# Patient Record
Sex: Female | Born: 1980
Health system: Southern US, Community
[De-identification: ages and names within clinical notes are randomized; demographics above are authoritative.]

## PROBLEM LIST (undated history)

## (undated) DIAGNOSIS — E039 Hypothyroidism, unspecified: Secondary | ICD-10-CM

## (undated) DIAGNOSIS — O24419 Gestational diabetes mellitus in pregnancy, unspecified control: Secondary | ICD-10-CM

## (undated) HISTORY — PX: BREAST SURGERY: SHX581

## (undated) HISTORY — PX: BREAST REDUCTION SURGERY: SHX8

---

## 2005-03-30 DIAGNOSIS — O24419 Gestational diabetes mellitus in pregnancy, unspecified control: Secondary | ICD-10-CM

## 2005-03-30 HISTORY — DX: Gestational diabetes mellitus in pregnancy, unspecified control: O24.419

## 2006-12-09 ENCOUNTER — Ambulatory Visit: Payer: Self-pay | Admitting: Otolaryngology

## 2007-03-31 HISTORY — PX: REDUCTION MAMMAPLASTY: SUR839

## 2008-09-03 ENCOUNTER — Ambulatory Visit: Payer: Self-pay

## 2010-01-17 ENCOUNTER — Ambulatory Visit: Payer: Self-pay | Admitting: Family Medicine

## 2011-10-24 ENCOUNTER — Emergency Department: Payer: Self-pay | Admitting: Emergency Medicine

## 2012-02-13 ENCOUNTER — Emergency Department: Payer: Self-pay | Admitting: Emergency Medicine

## 2012-02-13 LAB — CBC
HCT: 41.5 % (ref 35.0–47.0)
HGB: 13.7 g/dL (ref 12.0–16.0)
MCH: 28.9 pg (ref 26.0–34.0)
MCHC: 33 g/dL (ref 32.0–36.0)
MCV: 88 fL (ref 80–100)
RBC: 4.75 10*6/uL (ref 3.80–5.20)

## 2012-02-13 LAB — BASIC METABOLIC PANEL
BUN: 9 mg/dL (ref 7–18)
Calcium, Total: 8.6 mg/dL (ref 8.5–10.1)
Co2: 22 mmol/L (ref 21–32)
Creatinine: 0.78 mg/dL (ref 0.60–1.30)
EGFR (African American): >60
EGFR (Non-African Amer.): >60
Glucose: 128 mg/dL — ABNORMAL HIGH (ref 65–99)
Osmolality: 278 (ref 275–301)
Potassium: 3.9 mmol/L (ref 3.5–5.1)
Sodium: 139 mmol/L (ref 136–145)

## 2012-02-13 LAB — TSH: Thyroid Stimulating Horm: 1.57 u[IU]/mL

## 2014-02-01 LAB — OB RESULTS CONSOLE RPR: RPR: NONREACTIVE

## 2014-02-01 LAB — OB RESULTS CONSOLE TSH: TSH: 5.74

## 2014-02-01 LAB — OB RESULTS CONSOLE VARICELLA ZOSTER ANTIBODY, IGG: Varicella: IMMUNE

## 2014-02-01 LAB — OB RESULTS CONSOLE HGB/HCT, BLOOD
HCT: 36 %
Hemoglobin: 11.8 g/dL

## 2014-02-01 LAB — OB RESULTS CONSOLE PLATELET COUNT: Platelets: 336 10*3/uL

## 2014-02-01 LAB — OB RESULTS CONSOLE RUBELLA ANTIBODY, IGM: Rubella: IMMUNE

## 2014-02-01 LAB — OB RESULTS CONSOLE HIV ANTIBODY (ROUTINE TESTING): HIV: NONREACTIVE

## 2014-02-01 LAB — OB RESULTS CONSOLE GC/CHLAMYDIA
Chlamydia: NEGATIVE
GC PROBE AMP, GENITAL: NEGATIVE

## 2014-02-01 LAB — OB RESULTS CONSOLE ABO/RH: RH TYPE: POSITIVE

## 2014-02-01 LAB — OB RESULTS CONSOLE HEPATITIS B SURFACE ANTIGEN: Hepatitis B Surface Ag: NEGATIVE

## 2014-03-30 NOTE — L&D Delivery Note (Signed)
Delivery Note At 9:43 PM a viable female was delivered via VBAC, Spontaneous (Presentation: Left Occiput Anterior). Infant's name: Will  APGAR: 9, 9 weight 5 lb 6.8 oz (2460 g).   Placenta status: Intact, Spontaneous.  Cord: 3 vessels with the following complications: Short cord. Anesthesia: None  Episiotomy: None Lacerations: 1st degree;Labial Suture Repair: 2.0 vicryl under local anesthesia  Est. Blood Loss (mL): 275 Mom to postpartum.  Baby to Couplet care / Skin to Skin.   Un-medicated VBAC of a live viable female infant in LOA position. After delivery, the infant was placed on the maternal abdomen where a short cord was noted. The cord was clamped and cut by the fob after pulsation ceased. Apgars were 9/9. The placenta was then delivered spontaneously via schultz mechanism rapidly after the delivery of the infant and was intact with a 3-vessel cord. Pitocin was then started and the uterus was firm with fundal massage and had minimal bleeding. EBL was ~275. The perineum was inspected and was found to have a small first degree and left labial tear. It was repaired under local anesthesia with a 2-0 vicryl and was  Hemostatic after repair. Mother and baby are stable and bonding well.   Jannet MantisSubudhi,  Kalinda Romaniello 08/29/2014, 10:25 PM

## 2014-08-14 LAB — OB RESULTS CONSOLE GBS: GBS: NEGATIVE

## 2014-08-29 ENCOUNTER — Inpatient Hospital Stay
Admission: EM | Admit: 2014-08-29 | Discharge: 2014-08-31 | DRG: 775 | Disposition: A | Discharge status: Home / Self Care | Payer: 59 | Attending: Obstetrics and Gynecology | Admitting: Obstetrics and Gynecology

## 2014-08-29 ENCOUNTER — Encounter: Payer: Self-pay | Admitting: *Deleted

## 2014-08-29 DIAGNOSIS — Z79899 Other long term (current) drug therapy: Secondary | ICD-10-CM

## 2014-08-29 DIAGNOSIS — O3421 Maternal care for scar from previous cesarean delivery: Secondary | ICD-10-CM | POA: Diagnosis present

## 2014-08-29 DIAGNOSIS — O09523 Supervision of elderly multigravida, third trimester: Secondary | ICD-10-CM

## 2014-08-29 DIAGNOSIS — O99284 Endocrine, nutritional and metabolic diseases complicating childbirth: Secondary | ICD-10-CM | POA: Diagnosis present

## 2014-08-29 DIAGNOSIS — D62 Acute posthemorrhagic anemia: Secondary | ICD-10-CM | POA: Diagnosis present

## 2014-08-29 DIAGNOSIS — Z8632 Personal history of gestational diabetes: Secondary | ICD-10-CM

## 2014-08-29 DIAGNOSIS — O9081 Anemia of the puerperium: Secondary | ICD-10-CM | POA: Diagnosis not present

## 2014-08-29 DIAGNOSIS — Z3A38 38 weeks gestation of pregnancy: Secondary | ICD-10-CM | POA: Diagnosis present

## 2014-08-29 DIAGNOSIS — O09213 Supervision of pregnancy with history of pre-term labor, third trimester: Secondary | ICD-10-CM

## 2014-08-29 DIAGNOSIS — O34219 Maternal care for unspecified type scar from previous cesarean delivery: Secondary | ICD-10-CM | POA: Diagnosis present

## 2014-08-29 DIAGNOSIS — Z98891 History of uterine scar from previous surgery: Secondary | ICD-10-CM

## 2014-08-29 DIAGNOSIS — O99283 Endocrine, nutritional and metabolic diseases complicating pregnancy, third trimester: Secondary | ICD-10-CM | POA: Diagnosis present

## 2014-08-29 HISTORY — DX: Hypothyroidism, unspecified: E03.9

## 2014-08-29 HISTORY — DX: Gestational diabetes mellitus in pregnancy, unspecified control: O24.419

## 2014-08-29 LAB — CBC
HCT: 40.9 % (ref 35.0–47.0)
Hemoglobin: 13.6 g/dL (ref 12.0–16.0)
MCH: 29 pg (ref 26.0–34.0)
MCHC: 33.3 g/dL (ref 32.0–36.0)
MCV: 87.1 fL (ref 80.0–100.0)
Platelets: 230 10*3/uL (ref 150–440)
RBC: 4.69 MIL/uL (ref 3.80–5.20)
RDW: 13.9 % (ref 11.5–14.5)
WBC: 12.3 10*3/uL — AB (ref 3.6–11.0)

## 2014-08-29 LAB — COMPREHENSIVE METABOLIC PANEL
ALT: 19 U/L (ref 14–54)
ANION GAP: 8 (ref 5–15)
AST: 38 U/L (ref 15–41)
Albumin: 2.9 g/dL — ABNORMAL LOW (ref 3.5–5.0)
Alkaline Phosphatase: 172 U/L — ABNORMAL HIGH (ref 38–126)
BUN: 9 mg/dL (ref 6–20)
CHLORIDE: 108 mmol/L (ref 101–111)
CO2: 20 mmol/L — ABNORMAL LOW (ref 22–32)
Calcium: 8.5 mg/dL — ABNORMAL LOW (ref 8.9–10.3)
Creatinine, Ser: 0.59 mg/dL (ref 0.44–1.00)
GFR calc non Af Amer: >60 mL/min (ref >60)
Glucose, Bld: 64 mg/dL — ABNORMAL LOW (ref 65–99)
Potassium: 4.8 mmol/L (ref 3.5–5.1)
Sodium: 136 mmol/L (ref 135–145)
TOTAL PROTEIN: 7.3 g/dL (ref 6.5–8.1)
Total Bilirubin: 0.9 mg/dL (ref 0.3–1.2)

## 2014-08-29 LAB — TYPE AND SCREEN
ABO/RH(D): O POS
Antibody Screen: NEGATIVE

## 2014-08-29 MED ORDER — ACETAMINOPHEN 325 MG PO TABS: 650.0000 mg | ORAL_TABLET | ORAL | Status: DC | PRN

## 2014-08-29 MED ORDER — OXYTOCIN BOLUS FROM INFUSION
500.0000 mL | INTRAVENOUS | Status: DC
  Administered 2014-08-29: 500 mL via INTRAVENOUS

## 2014-08-29 MED ORDER — LEVOTHYROXINE SODIUM 75 MCG PO TABS
75.0000 ug | ORAL_TABLET | Freq: Every day | ORAL | Status: DC
  Filled 2014-08-29 (×2): qty 1

## 2014-08-29 MED ORDER — LACTATED RINGERS IV SOLN
INTRAVENOUS | Status: DC
  Administered 2014-08-29: 19:00:00 via INTRAVENOUS

## 2014-08-29 MED ORDER — BUTORPHANOL TARTRATE 1 MG/ML IJ SOLN
1.0000 mg | INTRAMUSCULAR | Status: DC | PRN
  Administered 2014-08-29: 2 mg via INTRAVENOUS

## 2014-08-29 MED ORDER — LACTATED RINGERS IV SOLN: 500.0000 mL | INTRAVENOUS | Status: DC | PRN

## 2014-08-29 MED ORDER — LIDOCAINE HCL (PF) 1 % IJ SOLN
INTRAMUSCULAR | Status: AC
Start: 2014-08-29 — End: 2014-08-29
  Administered 2014-08-29: 30 mL
  Filled 2014-08-29: qty 30

## 2014-08-29 MED ORDER — OXYTOCIN 40 UNITS IN LACTATED RINGERS INFUSION - SIMPLE MED: 62.5000 mL/h | INTRAVENOUS | Status: DC

## 2014-08-29 MED ORDER — BUTORPHANOL TARTRATE 1 MG/ML IJ SOLN
INTRAMUSCULAR | Status: AC
  Administered 2014-08-29: 2 mg via INTRAVENOUS
  Filled 2014-08-29: qty 2

## 2014-08-29 MED ORDER — ONDANSETRON HCL 4 MG/2ML IJ SOLN: 4.0000 mg | Freq: Four times a day (QID) | INTRAMUSCULAR | Status: DC | PRN

## 2014-08-29 MED ORDER — OXYTOCIN 40 UNITS IN LACTATED RINGERS INFUSION - SIMPLE MED
INTRAVENOUS | Status: AC
  Filled 2014-08-29: qty 1000

## 2014-08-29 NOTE — H&P (Signed)
Obstetric History and Physical  Kirke CorinRebecca R Hegel is a 34 y.o. 272 [id number] with IUP at 6368w3d presenting for contractions that started earlier today. Patient states she has been having  regular contractions, none vaginal bleeding, intact membranes, with active fetal movement.    Prenatal Course Source of Care: [facility]  with onset of care at 8 weeks Pregnancy complications or risks:  Pt is AMA, has a history of GDM with G2. She also has a history of a c/s for FITL with G1 with a successful VBAC in 2007 (was of a PTB  at 35 weeks). She has been on Makena this pregnancy. She is currently on synthroid for hypothyroidism Patient Active Problem List   Diagnosis Date Noted  . Labor and delivery, indication for care 08/29/2014  . History of cesarean section 08/29/2014  . History of successful vaginal birth after cesarean, currently pregnant 08/29/2014   She plans to breastfeed   Prenatal labs and studies: ABO, Rh: O/Positive/-- (11/05 0000) Antibody:   Rubella: Immune (11/05 0000) Varicella: immune RPR: Nonreactive (11/05 0000)  HBsAg: Negative (11/05 0000)  HIV: Non-reactive (11/05 0000)  WUX:LKGMWNUUGBS:Negative (05/17 0000) 1 hr Glucola  86 Genetic screening NIPT normal XY Anatomy US normal Tdap: given 07/06/14 Flu: NA   Past Medical History  Diagnosis Date  . Hypothyroidism   . Gestational diabetes 2007    Past Surgical History  Procedure Laterality Date  . Cesarean section    . Breast reduction surgery Bilateral   . Breast surgery    . Reduction mammaplasty      OB History  Gravida Para Term Preterm AB SAB TAB Ectopic Multiple Living  3 2 1 1      2     # Outcome Date GA Lbr Len/2nd Weight Sex Delivery Anes PTL Lv  3 Current           2 Preterm 10/16/05 10109w3d   M VBAC EPI Y Y  1 Term 09/26/03 9077w0d   M CS-LTranv Spinal N Y     Complications: Fetal Intolerance      History   Social History  . Marital Status: Single    Spouse Name: N/A  . Number of Children: N/A  . Years of  Education: N/A   Social History Main Topics  . Smoking status: Never Smoker   . Smokeless tobacco: Not on file  . Alcohol Use: No  . Drug Use: No  . Sexual Activity: Yes    Birth Control/ Protection: None   Other Topics Concern  . None   Social History Narrative  . None    Family History  Problem Relation Age of Onset  . Diabetes Mother   . Hypertension Maternal Grandfather     Prescriptions prior to admission  Medication Sig Dispense Refill Last Dose  . levothyroxine (SYNTHROID, LEVOTHROID) 75 MCG tablet Take 75 mcg by mouth daily before breakfast.   08/29/2014 at Unknown time  . Prenatal Vit-Fe Fumarate-FA (MULTIVITAMIN-PRENATAL) 27-0.8 MG TABS tablet Take 1 tablet by mouth daily at 12 noon.   08/29/2014 at Unknown time    No Known Allergies  Review of Systems: Negative except for what is mentioned in HPI.  Physical Exam: LMP 12/03/2013  BP 141/90 mmHg  Pulse 81  Temp(Src) 97.1 F (36.2 C) (Oral)  Resp 22  Ht 5\' 4"  (1.626 m)  Wt 170 lb (77.111 kg)  BMI 29.17 kg/m2  LMP 12/03/2013  GENERAL: Well-developed, well-nourished female in no acute distress.  LUNGS: Clear to auscultation bilaterally.  HEART: Regular rate and rhythm. ABDOMEN: Soft, nontender, nondistended, gravid. EXTREMITIES: Nontender, no edema, 2+ distal pulses.  Dilation: 6 Effacement (%): 80 Station: -1 Presentation: Vertex Exam by:: Toni Amend, CNM   Presentation: cephalic FHT:  Baseline rate 135-145 bpm   Variability moderate  Accelerations present   Decelerations none Contractions: Every 2 mins  Pertinent Labs/Studies:   No results found for this or any previous visit (from the past 24 hour(s)).  Labs pending currently   Assessment : RUTHELL FEIGENBAUM is a 34 y.o. [id number] at [redacted]w[redacted]d being admitted for labor.  Plan: Labor: Expectant management.   FWB: Reassuring fetal heart tracing.   Pt plans for no analgesia, but is open to IV medication or epidural-will give on request.  GBS  negative Delivery plan: Hopeful for vaginal delivery   Jannet Mantis, [facility]  Reviewed patient history and added recent vital signs into this note.  Noted to be mildly elevated.  Will continue to monitor.  Expect vaginal delivery. Was called to hospital per [facility] VBAC protocol. Will be in-house until delivery.

## 2014-08-29 NOTE — Discharge Summary (Signed)
Obstetrical Discharge Summary  Date of Admission: 08/29/2014 Date of Discharge: 08/31/2014 Primary OB: [facility]   Pt with a pregnancy complicated by AMA, hypothyroidism, history of cesarean section with a successful VBAC, preterm birth in 2007- [place] this [place].   Gestational Age at Delivery: 3151w3d  Antepartum complications: none Date of Delivery: 08/31/2014 Delivered By: Debbie Rivera. Debbie Rivera, CNM  Delivery Type: vaginal birth after cesarean (VBAC) Intrapartum complications/course: None Anesthesia: none Placenta: spontaneous Laceration: 1st degree and labial Episiotomy: none Baby: Liveborn female, Apgars 9/9, weight 5 lb. 6.8 oz  2460 g.   Post partum course: Since the delivery, patient has tolerate activity, diet, and daily functions without difficulty or complication.  Min lochia.  No breast concerns at this time.  No signs of depression currently.  Disposition: discharged home  Rh Immune globulin given: not applicable Rubella vaccine given: not applicable Tdap vaccine given in AP or PP setting: yes Flu vaccine given in AP or PP setting: not applicable Contraception: plans Mirena   Prenatal Labs: O POS//Rubella Immune//RPR negative//HIV negative/HepB Surface Ag negative//pap no abnormalities //plans to breastfeed//female  Plan:  Debbie Rivera was discharged to home in good condition. [facility]  Plan IUD then No future appointments.  Discharge Medications:   Medication List    TAKE these medications        ibuprofen 600 MG tablet  Commonly known as:  ADVIL,MOTRIN  Take 1 tablet (600 mg total) by mouth every 6 (six) hours as needed for moderate pain.     levothyroxine 75 MCG tablet  Commonly known as:  SYNTHROID, LEVOTHROID  Take 75 mcg by mouth daily before breakfast.     multivitamin-prenatal 27-0.8 MG Tabs tablet  Take 1 tablet by mouth daily at 12 noon.

## 2014-08-30 LAB — CBC
HEMATOCRIT: 37.2 % (ref 35.0–47.0)
Hemoglobin: 12.3 g/dL (ref 12.0–16.0)
MCH: 28.9 pg (ref 26.0–34.0)
MCHC: 33.1 g/dL (ref 32.0–36.0)
MCV: 87.3 fL (ref 80.0–100.0)
Platelets: 206 10*3/uL (ref 150–440)
RBC: 4.26 MIL/uL (ref 3.80–5.20)
RDW: 13.7 % (ref 11.5–14.5)
WBC: 20.1 10*3/uL — ABNORMAL HIGH (ref 3.6–11.0)

## 2014-08-30 LAB — PROTEIN / CREATININE RATIO, URINE: Creatinine, Urine: 53 mg/dL

## 2014-08-30 LAB — ABO/RH: ABO/RH(D): O POS

## 2014-08-30 MED ORDER — OXYCODONE-ACETAMINOPHEN 5-325 MG PO TABS: 2.0000 | ORAL_TABLET | ORAL | Status: DC | PRN

## 2014-08-30 MED ORDER — IBUPROFEN 600 MG PO TABS
600.0000 mg | ORAL_TABLET | Freq: Four times a day (QID) | ORAL | Status: DC
  Administered 2014-08-30: 600 mg via ORAL
  Filled 2014-08-30: qty 1

## 2014-08-30 MED ORDER — OXYTOCIN 40 UNITS IN LACTATED RINGERS INFUSION - SIMPLE MED: 62.5000 mL/h | Freq: Once | INTRAVENOUS | Status: AC

## 2014-08-30 MED ORDER — PRENATAL MULTIVITAMIN CH
1.0000 | ORAL_TABLET | Freq: Every day | ORAL | Status: DC
Start: 2014-08-30 — End: 2014-08-31
  Administered 2014-08-30: 1 via ORAL
  Filled 2014-08-30: qty 1

## 2014-08-30 MED ORDER — IBUPROFEN 600 MG PO TABS
600.0000 mg | ORAL_TABLET | Freq: Four times a day (QID) | ORAL | Status: DC
  Administered 2014-08-30 – 2014-08-31 (×3): 600 mg via ORAL
  Filled 2014-08-30 (×3): qty 1

## 2014-08-30 MED ORDER — SIMETHICONE 80 MG PO CHEW: 80.0000 mg | CHEWABLE_TABLET | ORAL | Status: DC | PRN

## 2014-08-30 MED ORDER — ACETAMINOPHEN 325 MG PO TABS: 650.0000 mg | ORAL_TABLET | ORAL | Status: DC | PRN

## 2014-08-30 MED ORDER — OXYCODONE-ACETAMINOPHEN 5-325 MG PO TABS: 1.0000 | ORAL_TABLET | ORAL | Status: DC | PRN

## 2014-08-30 MED ORDER — DIPHENHYDRAMINE HCL 25 MG PO CAPS: 25.0000 mg | ORAL_CAPSULE | Freq: Four times a day (QID) | ORAL | Status: DC | PRN

## 2014-08-30 MED ORDER — LANOLIN HYDROUS EX OINT: TOPICAL_OINTMENT | CUTANEOUS | Status: DC | PRN

## 2014-08-30 MED ORDER — METHYLERGONOVINE MALEATE 0.2 MG PO TABS: 0.2000 mg | ORAL_TABLET | ORAL | Status: DC | PRN

## 2014-08-30 MED ORDER — FERROUS SULFATE 325 (65 FE) MG PO TABS
325.0000 mg | ORAL_TABLET | Freq: Two times a day (BID) | ORAL | Status: DC
  Administered 2014-08-30 – 2014-08-31 (×2): 325 mg via ORAL
  Filled 2014-08-30 (×2): qty 1

## 2014-08-30 MED ORDER — LEVOTHYROXINE SODIUM 75 MCG PO TABS
75.0000 ug | ORAL_TABLET | Freq: Every day | ORAL | Status: DC
  Administered 2014-08-30 – 2014-08-31 (×2): 75 ug via ORAL
  Filled 2014-08-30 (×3): qty 1

## 2014-08-30 MED ORDER — SENNOSIDES-DOCUSATE SODIUM 8.6-50 MG PO TABS
2.0000 | ORAL_TABLET | ORAL | Status: DC
  Administered 2014-08-30: 2 via ORAL
  Filled 2014-08-30: qty 2

## 2014-08-30 MED ORDER — MAGNESIUM HYDROXIDE 400 MG/5ML PO SUSP: 30.0000 mL | ORAL | Status: DC | PRN

## 2014-08-30 MED ORDER — BENZOCAINE-MENTHOL 20-0.5 % EX AERO: 1.0000 "application " | INHALATION_SPRAY | CUTANEOUS | Status: DC | PRN

## 2014-08-30 MED ORDER — ZOLPIDEM TARTRATE 5 MG PO TABS: 5.0000 mg | ORAL_TABLET | Freq: Every evening | ORAL | Status: DC | PRN

## 2014-08-30 MED ORDER — ONDANSETRON HCL 4 MG PO TABS: 4.0000 mg | ORAL_TABLET | ORAL | Status: DC | PRN

## 2014-08-30 MED ORDER — METHYLERGONOVINE MALEATE 0.2 MG/ML IJ SOLN: 0.2000 mg | INTRAMUSCULAR | Status: DC | PRN

## 2014-08-30 MED ORDER — ONDANSETRON HCL 4 MG/2ML IJ SOLN: 4.0000 mg | INTRAMUSCULAR | Status: DC | PRN

## 2014-08-30 MED ORDER — WITCH HAZEL-GLYCERIN EX PADS: 1.0000 "application " | MEDICATED_PAD | CUTANEOUS | Status: DC | PRN

## 2014-08-30 MED ORDER — DIBUCAINE 1 % RE OINT: 1.0000 "application " | TOPICAL_OINTMENT | RECTAL | Status: DC | PRN

## 2014-08-30 NOTE — Progress Notes (Signed)
Subjective:  Doing well, minimal lochia  Objective:   Blood pressure 106/68, pulse 83, temperature 98 F (36.7 C), temperature source Oral, resp. rate 18, height 5' 4" (1.626 m), weight 77.111 kg (170 lb), last menstrual period 12/03/2013, SpO2 98 %, unknown if currently breastfeeding.  General: NAD Pulmonary: no increased work of breathing Abdomen: non-distended, non-tender, fundus firm at level of umbilicus Extremities: no edema, no erythema, no tenderness  Results for orders placed or performed during the hospital encounter of 08/29/14 (from the past 72 hour(s))  Comprehensive metabolic panel     Status: Abnormal   Collection Time: 08/29/14  3:02 PM  Result Value Ref Range   Sodium 136 135 - 145 mmol/L   Potassium 4.8 3.5 - 5.1 mmol/L    Comment: HEMOLYSIS AT THIS LEVEL MAY AFFECT RESULT   Chloride 108 101 - 111 mmol/L   CO2 20 (L) 22 - 32 mmol/L   Glucose, Bld 64 (L) 65 - 99 mg/dL   BUN 9 6 - 20 mg/dL   Creatinine, Ser 0.59 0.44 - 1.00 mg/dL   Calcium 8.5 (L) 8.9 - 10.3 mg/dL   Total Protein 7.3 6.5 - 8.1 g/dL   Albumin 2.9 (L) 3.5 - 5.0 g/dL   AST 38 15 - 41 U/L    Comment: HEMOLYSIS AT THIS LEVEL MAY AFFECT RESULT   ALT 19 14 - 54 U/L    Comment: HEMOLYSIS AT THIS LEVEL MAY AFFECT RESULT   Alkaline Phosphatase 172 (H) 38 - 126 U/L   Total Bilirubin 0.9 0.3 - 1.2 mg/dL    Comment: HEMOLYSIS AT THIS LEVEL MAY AFFECT RESULT   GFR calc non Af Amer >60 >60 mL/min   GFR calc Af Amer >60 >60 mL/min    Comment: (NOTE) The eGFR has been calculated using the CKD EPI equation. This calculation has not been validated in all clinical situations. eGFR's persistently <60 mL/min signify possible Chronic Kidney Disease.    Anion gap 8 5 - 15  CBC     Status: Abnormal   Collection Time: 08/29/14  7:01 PM  Result Value Ref Range   WBC 12.3 (H) 3.6 - 11.0 K/uL   RBC 4.69 3.80 - 5.20 MIL/uL   Hemoglobin 13.6 12.0 - 16.0 g/dL   HCT 40.9 35.0 - 47.0 %   MCV 87.1 80.0 - 100.0 fL   MCH 29.0 26.0 - 34.0 pg   MCHC 33.3 32.0 - 36.0 g/dL   RDW 13.9 11.5 - 14.5 %   Platelets 230 150 - 440 K/uL  Type and screen     Status: None   Collection Time: 08/29/14  7:01 PM  Result Value Ref Range   ABO/RH(D) O POS    Antibody Screen NEG    Sample Expiration 09/01/2014   ABO/Rh     Status: None   Collection Time: 08/29/14  7:02 PM  Result Value Ref Range   ABO/RH(D) O POS   Protein / creatinine ratio, urine     Status: None   Collection Time: 08/29/14 11:18 PM  Result Value Ref Range   Creatinine, Urine 53 mg/dL   Total Protein, Urine <6 mg/dL    Comment: NO NORMAL RANGE ESTABLISHED FOR THIS TEST   Protein Creatinine Ratio        0.00 - 0.15 mg/mg[Cre]    Comment: RESULT BELOW REPORTABLE RANGE, UNABLE TO CALCULATE.   CBC     Status: Abnormal   Collection Time: 08/30/14  4:51 AM  Result Value Ref Range  WBC 20.1 (H) 3.6 - 11.0 K/uL   RBC 4.26 3.80 - 5.20 MIL/uL   Hemoglobin 12.3 12.0 - 16.0 g/dL   HCT 37.2 35.0 - 47.0 %   MCV 87.3 80.0 - 100.0 fL   MCH 28.9 26.0 - 34.0 pg   MCHC 33.1 32.0 - 36.0 g/dL   RDW 13.7 11.5 - 14.5 %   Platelets 206 150 - 440 K/uL    Assessment:   34 y.o. [id number] postpartum day # 1 TOLAC  Plan:    1) Acute blood loss anemia - hemodynamically stable and asymptomatic - po ferrous sulfate  2) Breast/Bottle/Contraception  3) Disposition PPD#2

## 2014-08-31 LAB — RPR: RPR: NONREACTIVE

## 2014-08-31 MED ORDER — IBUPROFEN 600 MG PO TABS: 600.0000 mg | ORAL_TABLET | Freq: Four times a day (QID) | ORAL | Status: DC | PRN

## 2014-08-31 NOTE — Progress Notes (Signed)
Subjective:  Doing well, minimal lochia, tol diet.  Ready for discharge.  Objective:   AF, VSS General: NAD Pulmonary: no increased work of breathing Abdomen: non-distended, non-tender, fundus firm at level of umbilicus Extremities: no edema, no erythema, no tenderness  Results for orders placed or performed during the hospital encounter of 08/29/14 (from the past 72 hour(s))  Comprehensive metabolic panel     Status: Abnormal   Collection Time: 08/29/14  3:02 PM  Result Value Ref Range   Sodium 136 135 - 145 mmol/L   Potassium 4.8 3.5 - 5.1 mmol/L    Comment: HEMOLYSIS AT THIS LEVEL MAY AFFECT RESULT   Chloride 108 101 - 111 mmol/L   CO2 20 (L) 22 - 32 mmol/L   Glucose, Bld 64 (L) 65 - 99 mg/dL   BUN 9 6 - 20 mg/dL   Creatinine, Ser 0.59 0.44 - 1.00 mg/dL   Calcium 8.5 (L) 8.9 - 10.3 mg/dL   Total Protein 7.3 6.5 - 8.1 g/dL   Albumin 2.9 (L) 3.5 - 5.0 g/dL   AST 38 15 - 41 U/L    Comment: HEMOLYSIS AT THIS LEVEL MAY AFFECT RESULT   ALT 19 14 - 54 U/L    Comment: HEMOLYSIS AT THIS LEVEL MAY AFFECT RESULT   Alkaline Phosphatase 172 (H) 38 - 126 U/L   Total Bilirubin 0.9 0.3 - 1.2 mg/dL    Comment: HEMOLYSIS AT THIS LEVEL MAY AFFECT RESULT   GFR calc non Af Amer >60 >60 mL/min   GFR calc Af Amer >60 >60 mL/min    Comment: (NOTE) The eGFR has been calculated using the CKD EPI equation. This calculation has not been validated in all clinical situations. eGFR's persistently <60 mL/min signify possible Chronic Kidney Disease.    Anion gap 8 5 - 15  CBC     Status: Abnormal   Collection Time: 08/29/14  7:01 PM  Result Value Ref Range   WBC 12.3 (H) 3.6 - 11.0 K/uL   RBC 4.69 3.80 - 5.20 MIL/uL   Hemoglobin 13.6 12.0 - 16.0 g/dL   HCT 40.9 35.0 - 47.0 %   MCV 87.1 80.0 - 100.0 fL   MCH 29.0 26.0 - 34.0 pg   MCHC 33.3 32.0 - 36.0 g/dL   RDW 13.9 11.5 - 14.5 %   Platelets 230 150 - 440 K/uL  Type and screen     Status: None   Collection Time: 08/29/14  7:01 PM  Result  Value Ref Range   ABO/RH(D) O POS    Antibody Screen NEG    Sample Expiration 09/01/2014   RPR     Status: None   Collection Time: 08/29/14  7:01 PM  Result Value Ref Range   RPR Ser Ql Non Reactive Non Reactive    Comment: (NOTE) Performed At: [facility] [address] [place], [place] [id number] Lindon Romp MD [id number]   ABO/Rh     Status: None   Collection Time: 08/29/14  7:02 PM  Result Value Ref Range   ABO/RH(D) O POS   Protein / creatinine ratio, urine     Status: None   Collection Time: 08/29/14 11:18 PM  Result Value Ref Range   Creatinine, Urine 53 mg/dL   Total Protein, Urine <6 mg/dL    Comment: NO NORMAL RANGE ESTABLISHED FOR THIS TEST   Protein Creatinine Ratio        0.00 - 0.15 mg/mg[Cre]    Comment: RESULT BELOW REPORTABLE RANGE, UNABLE TO  CALCULATE.   CBC     Status: Abnormal   Collection Time: 08/30/14  4:51 AM  Result Value Ref Range   WBC 20.1 (H) 3.6 - 11.0 K/uL   RBC 4.26 3.80 - 5.20 MIL/uL   Hemoglobin 12.3 12.0 - 16.0 g/dL   HCT 37.2 35.0 - 47.0 %   MCV 87.3 80.0 - 100.0 fL   MCH 28.9 26.0 - 34.0 pg   MCHC 33.1 32.0 - 36.0 g/dL   RDW 13.7 11.5 - 14.5 %   Platelets 206 150 - 440 K/uL    Assessment:   34 y.o. [id number] postpartum day # 2 VBAC  Plan:    1) Acute blood loss anemia - hemodynamically stable and asymptomatic - po ferrous sulfate  2) Breast  3) Disposition HOME  4) IUD pp

## 2014-08-31 NOTE — Progress Notes (Signed)
D/C order from MD.  Reviewed d/c instructions and prescriptions with patient and answered any questions.  Patient d/c home with infant via wheelchair by nursing/auxillary. 

## 2014-08-31 NOTE — Discharge Instructions (Signed)
Postpartum Care After Vaginal Delivery °After you deliver your newborn (postpartum period), the usual stay in the hospital is 24-72 hours. If there were problems with your labor or delivery, or if you have other medical problems, you might be in the hospital longer.  °While you are in the hospital, you will receive help and instructions on how to care for yourself and your newborn during the postpartum period.  °While you are in the hospital: °· Be sure to tell your nurses if you have pain or discomfort, as well as where you feel the pain and what makes the pain worse. °· If you had an incision made near your vagina (episiotomy) or if you had some tearing during delivery, the nurses may put ice packs on your episiotomy or tear. The ice packs may help to reduce the pain and swelling. °· If you are breastfeeding, you may feel uncomfortable contractions of your uterus for a couple of weeks. This is normal. The contractions help your uterus get back to normal size. °· It is normal to have some bleeding after delivery. °¨ For the first 1-3 days after delivery, the flow is red and the amount may be similar to a period. °¨ It is common for the flow to start and stop. °¨ In the first few days, you may pass some small clots. Let your nurses know if you begin to pass large clots or your flow increases. °¨ Do not  flush blood clots down the toilet before having the nurse look at them. °¨ During the next 3-10 days after delivery, your flow should become more watery and pink or brown-tinged in color. °¨ Ten to fourteen days after delivery, your flow should be a small amount of yellowish-white discharge. °¨ The amount of your flow will decrease over the first few weeks after delivery. Your flow may stop in 6-8 weeks. Most women have had their flow stop by 12 weeks after delivery. °· You should change your sanitary pads frequently. °· Wash your hands thoroughly with soap and water for at least 20 seconds after changing pads, using  the toilet, or before holding or feeding your newborn. °· You should feel like you need to empty your bladder within the first 6-8 hours after delivery. °· In case you become weak, lightheaded, or faint, call your nurse before you get out of bed for the first time and before you take a shower for the first time. °· Within the first few days after delivery, your breasts may begin to feel tender and full. This is called engorgement. Breast tenderness usually goes away within 48-72 hours after engorgement occurs. You may also notice milk leaking from your breasts. If you are not breastfeeding, do not stimulate your breasts. Breast stimulation can make your breasts produce more milk. °· Spending as much time as possible with your newborn is very important. During this time, you and your newborn can feel close and get to know each other. Having your newborn stay in your room (rooming in) will help to strengthen the bond with your newborn.  It will give you time to get to know your newborn and become comfortable caring for your newborn. °· Your hormones change after delivery. Sometimes the hormone changes can temporarily cause you to feel sad or tearful. These feelings should not last more than a few days. If these feelings last longer than that, you should talk to your caregiver. °· If desired, talk to your caregiver about methods of family planning or contraception. °·   Talk to your caregiver about immunizations. Your caregiver may want you to have the following immunizations before leaving the hospital: °¨ Tetanus, diphtheria, and pertussis (Tdap) or tetanus and diphtheria (Td) immunization. It is very important that you and your family (including grandparents) or others caring for your newborn are up-to-date with the Tdap or Td immunizations. The Tdap or Td immunization can help protect your newborn from getting ill. °¨ Rubella immunization. °¨ Varicella (chickenpox) immunization. °¨ Influenza immunization. You should  receive this annual immunization if you did not receive the immunization during your pregnancy. °Document Released: 01/11/2007 Document Revised: 12/09/2011 Document Reviewed: 11/11/2011 °ExitCare® Patient Information ©2015 [employer]. This information is not intended to replace advice given to you by your health care provider. Make sure you discuss any questions you have with your health care provider. ° °Please call your doctor or return to the ER if you experience any chest pains, shortness of breath, fever greater than 101, any heavy bleeding or large clots, and foul smelling vaginal discharge, any worsening abdominal pain & cramping that is not controlled by pain medication, or any signs of post partum depression.  No tampons, enemas, douches, or sexual intercourse for 6 weeks.  Also avoid tub baths, hot tubs, or swimming for 6 weeks. ° °

## 2015-02-28 LAB — HM PAP SMEAR: HM Pap smear: NORMAL

## 2015-04-19 DIAGNOSIS — Z6826 Body mass index (BMI) 26.0-26.9, adult: Secondary | ICD-10-CM | POA: Diagnosis not present

## 2015-04-19 DIAGNOSIS — E039 Hypothyroidism, unspecified: Secondary | ICD-10-CM | POA: Diagnosis not present

## 2015-04-19 DIAGNOSIS — R635 Abnormal weight gain: Secondary | ICD-10-CM | POA: Diagnosis not present

## 2015-04-19 DIAGNOSIS — Z01419 Encounter for gynecological examination (general) (routine) without abnormal findings: Secondary | ICD-10-CM | POA: Diagnosis not present

## 2015-05-22 ENCOUNTER — Encounter: Payer: 59 | Attending: Obstetrics & Gynecology | Admitting: Dietician

## 2015-05-22 VITALS — Ht 64.0 in | Wt 157.6 lb

## 2015-05-22 DIAGNOSIS — E039 Hypothyroidism, unspecified: Secondary | ICD-10-CM | POA: Insufficient documentation

## 2015-05-22 DIAGNOSIS — E663 Overweight: Secondary | ICD-10-CM | POA: Diagnosis not present

## 2015-05-22 NOTE — Progress Notes (Signed)
Medical Nutrition Therapy: Visit start time: 0900  end time: 1000  Assessment:  Diagnosis: overweight Past medical history: hypothyroidism Psychosocial issues/ stress concerns: none Preferred learning method:  . Hands-on  Current weight: 157.6lbs  Height: 5'4" Medications, supplements: Synthroid  Progress and evaluation: Reports having lost several pounds recently using calorie-counting app on phone.          Has been riunning prior to pregnancy, is gradually resuming.          Feels like her biggest problem is carbs, loves bread.          She followed a Diabetes meal plan after second pregnancy which worked well, and wants an updated plan to lose weight since she has had a third baby.   Physical activity: walking, jogging for 60 minutes 3 times a week. Plans to increase frequency.  Dietary Intake:  Usual eating pattern includes 3 meals and 2 snacks per day. Dining out frequency: 2 meals per week.  Breakfast: 11/2 pcs peanut butter toast.  Snack: not usually, occasional fiber bar or cheese stick Lunch: tuna salad 2/21 no bread, 5 crackers. Usually sandwich.  Snack: same as am Supper: roasted vegetables butternut squash, edamame, onions, mushrooms, 1/2 bratwurst, not much meat usually. Trying to avoid carbs. Snack: sometimes if extra calories 1/2 cup mint ice cream, occ 85% dark chocolate.  Beverages: mostly water.  Note: Patient states she does not like to eat much meat, could go vegetarian.   Nutrition Care Education: Topics covered: weight management Basic nutrition: basic food groups, appropriate nutrient balance    Weight control: 1300kcal meal plan for weight loss with balance of 40%CHO, 30%protein, 30% fat per patient preference.       Resources for food portions, nutrition information, minimal daily carb and protein needs  Nutritional Diagnosis:  [place]-3.3 Overweight/obesity As related to recent pregnancy, hypothyroidism.  As evidenced by patient report.  Intervention:  Instruction as noted above.   No follow-up scheduled, as patient is self-motivated and can follow meal plan on her own.    Patient is making healthy food choices generally, and is exercising regularly.     Education Materials given:  . Food lists/ Planning A Balanced Meal  Daily Meal Plan Guide (lilly) Learner/ who was taught:  . Patient    Level of understanding: Marland Kitchen Verbalizes/ demonstrates competency  Demonstrated degree of understanding via:   Teach back Learning barriers: . None   Willingness to learn/ readiness for change: . Eager, change in progress  Monitoring and Evaluation:  Dietary intake, exercise, and body weight      follow up: prn

## 2015-05-22 NOTE — Patient Instructions (Signed)
Follow 1300kcal meal plan

## 2015-06-27 DIAGNOSIS — H5213 Myopia, bilateral: Secondary | ICD-10-CM | POA: Diagnosis not present

## 2015-06-27 DIAGNOSIS — H52223 Regular astigmatism, bilateral: Secondary | ICD-10-CM | POA: Diagnosis not present

## 2016-06-24 ENCOUNTER — Encounter: Payer: Self-pay | Admitting: Obstetrics & Gynecology

## 2016-06-24 ENCOUNTER — Ambulatory Visit (INDEPENDENT_AMBULATORY_CARE_PROVIDER_SITE_OTHER): Payer: 59 | Admitting: Obstetrics & Gynecology

## 2016-06-24 VITALS — BP 138/88 | HR 99 | Ht 64.0 in | Wt 138.0 lb

## 2016-06-24 DIAGNOSIS — Z0000 Encounter for general adult medical examination without abnormal findings: Secondary | ICD-10-CM | POA: Diagnosis not present

## 2016-06-24 DIAGNOSIS — E039 Hypothyroidism, unspecified: Secondary | ICD-10-CM | POA: Diagnosis not present

## 2016-06-24 MED ORDER — LEVOTHYROXINE SODIUM 75 MCG PO TABS: 75.0000 ug | ORAL_TABLET | Freq: Every day | ORAL | 12 refills | Status: DC

## 2016-06-24 NOTE — Progress Notes (Signed)
HPI:      [name] is a 36 y.o. [phone] who LMP was No LMP recorded., she presents today for her annual examination. The patient has no complaints today. The patient is sexually active.   last pap: was normal  The patient has regular exercise: yes.    GYN History: Contraception: IUD  PMHx: She  has a past medical history of Gestational diabetes (2007) and Hypothyroidism. Also,  has a past surgical history that includes Cesarean section; Breast reduction surgery (Bilateral); Breast surgery; and Reduction mammaplasty., family history includes Diabetes in her mother; Hypertension in her maternal grandfather.,  reports that she has been smoking.  She has never used smokeless tobacco. She reports that she does not drink alcohol or use drugs.  She has a current medication list which includes the following prescription(s): levonorgestrel, ibuprofen, levothyroxine, and multivitamin-prenatal. Also, has No Known Allergies.  Review of Systems  Constitutional: Negative for chills, fever and malaise/fatigue.  HENT: Negative for congestion, sinus pain and sore throat.   Eyes: Negative for blurred vision and pain.  Respiratory: Negative for cough and wheezing.   Cardiovascular: Negative for chest pain and leg swelling.  Gastrointestinal: Negative for abdominal pain, constipation, diarrhea, heartburn, nausea and vomiting.  Genitourinary: Negative for dysuria, frequency, hematuria and urgency.  Musculoskeletal: Negative for back pain, joint pain, myalgias and neck pain.  Skin: Negative for itching and rash.  Neurological: Negative for dizziness, tremors and weakness.  Endo/Heme/Allergies: Does not bruise/bleed easily.  Psychiatric/Behavioral: Negative for depression. The patient is not nervous/anxious and does not have insomnia.     Objective: BP 138/88   Pulse 99   Ht 5\' 4"  (1.626 m)   Wt 138 lb (62.6 kg)   BMI 23.69 kg/m  Physical Exam  Constitutional: She is oriented to person,  place, and time. She appears well-developed and well-nourished. No distress.  Genitourinary: Rectum normal, vagina normal and uterus normal. Pelvic exam was performed with patient supine. There is no rash or lesion on the right labia. There is no rash or lesion on the left labia. Vagina exhibits no lesion. No bleeding in the vagina. Right adnexum does not display mass and does not display tenderness. Left adnexum does not display mass and does not display tenderness. Cervix does not exhibit motion tenderness, lesion, friability or polyp.   Uterus is mobile and midaxial. Uterus is not enlarged or exhibiting a mass.  Genitourinary Comments: IUD strings 2 cm  HENT:  Head: Normocephalic and atraumatic. Head is without laceration.  Right Ear: Hearing normal.  Left Ear: Hearing normal.  Nose: No epistaxis.  No foreign bodies.  Mouth/Throat: Uvula is midline, oropharynx is clear and moist and mucous membranes are normal.  Eyes: Pupils are equal, round, and reactive to light.  Neck: Normal range of motion. Neck supple. No thyromegaly present.  Cardiovascular: Normal rate and regular rhythm.  Exam reveals no gallop and no friction rub.   No murmur heard. Pulmonary/Chest: Effort normal and breath sounds normal. No respiratory distress. She has no wheezes. Right breast exhibits no mass, no skin change and no tenderness. Left breast exhibits no mass, no skin change and no tenderness.  Abdominal: Soft. Bowel sounds are normal. She exhibits no distension. There is no tenderness. There is no rebound.  Musculoskeletal: Normal range of motion.  Neurological: She is alert and oriented to person, place, and time. No cranial nerve deficit.  Skin: Skin is warm and dry.  Psychiatric: She has a normal mood and affect.  Judgment normal.  Vitals reviewed.   Assessment:  ANNUAL EXAM 1. Annual physical exam   2. Acquired hypothyroidism      Screening Plan:            1.  Cervical Screening-  Pap smear schedule  reviewed with patient  2. Breast screening- Exam annually and mammogram>40 planned   3. . Labs THYROID today here  4. Counseling for contraception: IUD  Other:  1. Annual physical exam  2. Acquired hypothyroidism - TSH - cont levothyroxine    F/U  Return in about 1 year (around 06/24/2017) for Annual.  Annamarie Major, MD, [facility], [facility] 06/24/2016  11:44 AM

## 2016-06-25 LAB — TSH: TSH: 1.74 u[IU]/mL (ref 0.450–4.500)

## 2016-06-25 NOTE — Progress Notes (Signed)
Tried to call patient w results, but voice mail had a man's voice and name (no mention of a Larita FifeLynn or Lurena JoinerRebecca), so wondering if there is another number.  If you find one plz fwd back to me.  If she calls, then let her know TSH is dead-center normal and no change in dosing is recommended unless she just wants to try lower dose and then recehck levels again.

## 2016-06-25 NOTE — Progress Notes (Signed)
Pt aware of results 

## 2017-06-24 ENCOUNTER — Telehealth: Payer: Self-pay | Admitting: Family Medicine

## 2017-06-24 NOTE — Telephone Encounter (Signed)
Patient has not been seen since 12/21/13- would need a new patient appointment since has not been seen. Welcome to book new patient appointment with me, but I don't know when I'm booking to.

## 2017-06-24 NOTE — Telephone Encounter (Signed)
Copied from CRM [phone]. Topic: Appointment Scheduling - Scheduling Inquiry for Clinic >> Jun 22, 2017 10:35 AM Clack, Princella PellegriniJessica D wrote: Reason for CRM: Pt PCP listed as Dossie Arbourrissman, I do not see where she has been seen in the office. Also pt would like to know [facility]  Please f/u with pt.

## 2017-07-15 ENCOUNTER — Encounter: Payer: Self-pay | Admitting: Family Medicine

## 2017-07-15 ENCOUNTER — Ambulatory Visit (INDEPENDENT_AMBULATORY_CARE_PROVIDER_SITE_OTHER): Payer: No Typology Code available for payment source | Admitting: Family Medicine

## 2017-07-15 VITALS — BP 123/86 | HR 82 | Ht 64.5 in | Wt 136.0 lb

## 2017-07-15 DIAGNOSIS — Z7689 Persons encountering health services in other specified circumstances: Secondary | ICD-10-CM

## 2017-07-15 DIAGNOSIS — E039 Hypothyroidism, unspecified: Secondary | ICD-10-CM

## 2017-07-15 NOTE — Progress Notes (Signed)
   BP 123/86   Pulse 82   Ht 5' 4.5" (1.638 m)   Wt 136 lb (61.7 kg)   SpO2 98%   BMI 22.98 kg/m    Subjective:    Patient ID: Debbie Rivera, female    DOB: May 17, 1980, 37 y.o.   MRN: [id number]  HPI: Debbie Rivera is a 37 y.o. female  Chief Complaint  Patient presents with  . Establish Care  . Thyroid Problem   Pt here today to establish care. Only known medical problem is hypothyroidism, which has been doing very well the past year on 75 mcg synthroid. No side effects, denies any sxs of poor control such as constipation, diarrhea, dry skin, fatigue, depression. Last TSH check was a little over a year ago which was WNL.   No other concerns, due for CPE in the summer/early fall.   Relevant past medical, surgical, family and social history reviewed and updated as indicated. Interim medical history since our last visit reviewed. Allergies and medications reviewed and updated.  Review of Systems  Per HPI unless specifically indicated above     Objective:    BP 123/86   Pulse 82   Ht 5' 4.5" (1.638 m)   Wt 136 lb (61.7 kg)   SpO2 98%   BMI 22.98 kg/m   Wt Readings from Last 3 Encounters:  07/15/17 136 lb (61.7 kg)  06/24/16 138 lb (62.6 kg)  05/22/15 157 lb 9.6 oz (71.5 kg)    Physical Exam  Constitutional: She is oriented to person, place, and time. She appears well-developed and well-nourished. No distress.  HENT:  Head: Atraumatic.  Eyes: Pupils are equal, round, and reactive to light. Conjunctivae are normal.  Neck: Normal range of motion. Neck supple. No thyromegaly present.  Cardiovascular: Normal rate, regular rhythm, normal heart sounds and intact distal pulses.  Pulmonary/Chest: Breath sounds normal.  Musculoskeletal: Normal range of motion.  Neurological: She is alert and oriented to person, place, and time.  Skin: Skin is warm and dry.  Psychiatric: She has a normal mood and affect. Her behavior is normal.  Nursing note and vitals  reviewed.   Results for orders placed or performed in visit on 07/15/17  TSH  Result Value Ref Range   TSH 2.960 0.450 - 4.500 uIU/mL      Assessment & Plan:   Problem List Items Addressed This Visit      Endocrine   Hypothyroidism - Primary    Will check TSH today and adjust dose if needed. Continue current regimen      Relevant Orders   TSH (Completed)    Other Visit Diagnoses    Encounter to establish care           Follow up plan: Return in about 6 months (around 01/14/2018) for CPE.

## 2017-07-16 ENCOUNTER — Telehealth: Payer: Self-pay | Admitting: Family Medicine

## 2017-07-16 DIAGNOSIS — E039 Hypothyroidism, unspecified: Secondary | ICD-10-CM

## 2017-07-16 LAB — TSH: TSH: 2.96 u[IU]/mL (ref 0.450–4.500)

## 2017-07-16 MED ORDER — LEVOTHYROXINE SODIUM 75 MCG PO TABS: 75.0000 ug | ORAL_TABLET | Freq: Every day | ORAL | 12 refills | Status: DC

## 2017-07-16 NOTE — Telephone Encounter (Signed)
Patient notified

## 2017-07-16 NOTE — Telephone Encounter (Signed)
Please let her know that her thyroid came back normal and I've sent a year supply to her pharmacy. THanks!

## 2017-07-18 NOTE — Patient Instructions (Signed)
Follow up for CPE 

## 2017-07-18 NOTE — Assessment & Plan Note (Signed)
Will check TSH today and adjust dose if needed. Continue current regimen

## 2018-01-18 ENCOUNTER — Encounter: Payer: No Typology Code available for payment source | Admitting: Family Medicine

## 2018-01-31 ENCOUNTER — Encounter: Payer: Self-pay | Admitting: Family Medicine

## 2018-01-31 ENCOUNTER — Ambulatory Visit (INDEPENDENT_AMBULATORY_CARE_PROVIDER_SITE_OTHER): Payer: No Typology Code available for payment source | Admitting: Family Medicine

## 2018-01-31 VITALS — BP 118/78 | HR 92 | Temp 98.6°F | Ht 64.5 in | Wt 133.2 lb

## 2018-01-31 DIAGNOSIS — Z0000 Encounter for general adult medical examination without abnormal findings: Secondary | ICD-10-CM | POA: Diagnosis not present

## 2018-01-31 DIAGNOSIS — Z8632 Personal history of gestational diabetes: Secondary | ICD-10-CM | POA: Diagnosis not present

## 2018-01-31 DIAGNOSIS — E039 Hypothyroidism, unspecified: Secondary | ICD-10-CM | POA: Diagnosis not present

## 2018-01-31 LAB — UA/M W/RFLX CULTURE, ROUTINE
BILIRUBIN UA: NEGATIVE
GLUCOSE, UA: NEGATIVE
Leukocytes, UA: NEGATIVE
Nitrite, UA: NEGATIVE
Protein, UA: NEGATIVE
RBC UA: NEGATIVE
SPEC GRAV UA: 1.025 (ref 1.005–1.030)
UUROB: 0.2 mg/dL (ref 0.2–1.0)
pH, UA: 5 (ref 5.0–7.5)

## 2018-01-31 NOTE — Progress Notes (Signed)
BP 118/78   Pulse 92   Temp 98.6 F (37 C)   Ht 5' 4.5" (1.638 m)   Wt 133 lb 3 oz (60.4 kg)   LMP 01/29/2018 (Exact Date)   SpO2 95%   BMI 22.51 kg/m    Subjective:    Patient ID: Debbie Rivera, female    DOB: June 17, 1980, 37 y.o.   MRN: [id number]  HPI: Debbie Rivera is a 37 y.o. female presenting on 01/31/2018 for comprehensive medical examination. Current medical complaints include:see below  Gestational diabetes with her second pregnancy. Strong fhx of diabetes. Wanting to be checked.   Taking 75 mcg synthroid for her hypothyroidism. Feeling well overall.   Sees GYN for well woman exams, plans to schedule for sometime in January.   She currently lives with: Menopausal Symptoms: no  Depression Screen done today and results listed below:  Depression screen [facility] 2/9 01/31/2018 07/15/2017 05/22/2015  Decreased Interest 0 0 0  Down, Depressed, Hopeless 0 0 0  PHQ - 2 Score 0 0 0    The patient does not have a history of falls. I did not complete a risk assessment for falls. A plan of care for falls was not documented.   Past Medical History:  Past Medical History:  Diagnosis Date  . Gestational diabetes 2007  . Hypothyroidism     Surgical History:  Past Surgical History:  Procedure Laterality Date  . BREAST REDUCTION SURGERY Bilateral   . BREAST SURGERY    . CESAREAN SECTION    . REDUCTION MAMMAPLASTY      Medications:  Current Outpatient Medications on File Prior to Visit  Medication Sig  . ibuprofen (ADVIL,MOTRIN) 600 MG tablet Take 1 tablet (600 mg total) by mouth every 6 (six) hours as needed for moderate pain. (Patient not taking: Reported on 05/22/2015)  . levonorgestrel (MIRENA) 20 MCG/24HR IUD 1 each by Intrauterine route once.   No current facility-administered medications on file prior to visit.     Allergies:  No Known Allergies  Social History:  Social History   Socioeconomic History  . Marital status: Married    Spouse name: Not on file    . Number of children: Not on file  . Years of education: Not on file  . Highest education level: Not on file  Occupational History  . Not on file  Social Needs  . Financial resource strain: Not on file  . Food insecurity:    Worry: Not on file    Inability: Not on file  . Transportation needs:    Medical: Not on file    Non-medical: Not on file  Tobacco Use  . Smoking status: Light Tobacco Smoker  . Smokeless tobacco: Never Used  Substance and Sexual Activity  . Alcohol use: No  . Drug use: No  . Sexual activity: Yes    Birth control/protection: IUD  Lifestyle  . Physical activity:    Days per week: Not on file    Minutes per session: Not on file  . Stress: Not on file  Relationships  . Social connections:    Talks on phone: Not on file    Gets together: Not on file    Attends religious service: Not on file    Active member of club or organization: Not on file    Attends meetings of clubs or organizations: Not on file    Relationship status: Not on file  . Intimate partner violence:    Fear of current or  ex partner: Not on file    Emotionally abused: Not on file    Physically abused: Not on file    Forced sexual activity: Not on file  Other Topics Concern  . Not on file  Social History Narrative  . Not on file   Social History   Tobacco Use  Smoking Status Light Tobacco Smoker  Smokeless Tobacco Never Used   Social History   Substance and Sexual Activity  Alcohol Use No    Family History:  Family History  Problem Relation Age of Onset  . Diabetes Mother   . Hypertension Maternal Grandfather     Past medical history, surgical history, medications, allergies, family history and social history reviewed with patient today and changes made to appropriate areas of the chart.   Review of Systems - General ROS: negative Psychological ROS: negative Ophthalmic ROS: negative ENT ROS: negative Allergy and Immunology ROS: negative Hematological and Lymphatic  ROS: negative Endocrine ROS: negative Breast ROS: negative for breast lumps Respiratory ROS: no cough, shortness of breath, or wheezing Cardiovascular ROS: no chest pain or dyspnea on exertion Gastrointestinal ROS: no abdominal pain, change in bowel habits, or black or bloody stools Genito-Urinary ROS: no dysuria, trouble voiding, or hematuria Musculoskeletal ROS: negative Neurological ROS: no TIA or stroke symptoms Dermatological ROS: negative All other ROS negative except what is listed above and in the HPI.      Objective:    BP 118/78   Pulse 92   Temp 98.6 F (37 C)   Ht 5' 4.5" (1.638 m)   Wt 133 lb 3 oz (60.4 kg)   LMP 01/29/2018 (Exact Date)   SpO2 95%   BMI 22.51 kg/m   Wt Readings from Last 3 Encounters:  01/31/18 133 lb 3 oz (60.4 kg)  07/15/17 136 lb (61.7 kg)  06/24/16 138 lb (62.6 kg)    Physical Exam  Constitutional: She is oriented to person, place, and time. She appears well-developed and well-nourished. No distress.  HENT:  Head: Atraumatic.  Right Ear: External ear normal.  Left Ear: External ear normal.  Nose: Nose normal.  Mouth/Throat: Oropharynx is clear and moist. No oropharyngeal exudate.  Eyes: Pupils are equal, round, and reactive to light. Conjunctivae are normal. No scleral icterus.  Neck: Normal range of motion. Neck supple. No thyromegaly present.  Cardiovascular: Normal rate, regular rhythm, normal heart sounds and intact distal pulses.  Pulmonary/Chest: Effort normal and breath sounds normal. No respiratory distress.  Breast exam done through GYN  Abdominal: Soft. Bowel sounds are normal. She exhibits no mass. There is no tenderness.  Genitourinary:  Genitourinary Comments: GU exam done through GYN  Musculoskeletal: Normal range of motion. She exhibits no edema or tenderness.  Lymphadenopathy:    She has no cervical adenopathy.  Neurological: She is alert and oriented to person, place, and time. No cranial nerve deficit.  Skin: Skin  is warm and dry. No rash noted.  Psychiatric: She has a normal mood and affect. Her behavior is normal.  Nursing note and vitals reviewed.   Results for orders placed or performed in visit on 01/31/18  HM PAP SMEAR  Result Value Ref Range   HM Pap smear Normal   HgB A1c  Result Value Ref Range   Hgb A1c MFr Bld 5.5 4.8 - 5.6 %   Est. average glucose Bld gHb Est-mCnc 111 mg/dL  CBC with Differential/Platelet  Result Value Ref Range   WBC 5.2 3.4 - 10.8 x10E3/uL   RBC 4.91  3.77 - 5.28 x10E6/uL   Hemoglobin 14.0 11.1 - 15.9 g/dL   Hematocrit 40.9 81.1 - 46.6 %   MCV 85 79 - 97 fL   MCH 28.5 26.6 - 33.0 pg   MCHC 33.6 31.5 - 35.7 g/dL   RDW 91.4 78.2 - 95.6 %   Platelets 302 150 - 450 x10E3/uL   Neutrophils 42 Not Estab. %   Lymphs 46 Not Estab. %   Monocytes 8 Not Estab. %   Eos 3 Not Estab. %   Basos 1 Not Estab. %   Neutrophils Absolute 2.2 1.4 - 7.0 x10E3/uL   Lymphocytes Absolute 2.4 0.7 - 3.1 x10E3/uL   Monocytes Absolute 0.4 0.1 - 0.9 x10E3/uL   EOS (ABSOLUTE) 0.1 0.0 - 0.4 x10E3/uL   Basophils Absolute 0.1 0.0 - 0.2 x10E3/uL   Immature Granulocytes 0 Not Estab. %   Immature Grans (Abs) 0.0 0.0 - 0.1 x10E3/uL  Comprehensive metabolic panel  Result Value Ref Range   Glucose 84 65 - 99 mg/dL   BUN 9 6 - 20 mg/dL   Creatinine, Ser 2.13 0.57 - 1.00 mg/dL   GFR calc non Af Amer 104 >59 mL/min/1.73   GFR calc Af Amer 120 >59 mL/min/1.73   BUN/Creatinine Ratio 12 9 - 23   Sodium 141 134 - 144 mmol/L   Potassium 3.6 3.5 - 5.2 mmol/L   Chloride 100 96 - 106 mmol/L   CO2 22 20 - 29 mmol/L   Calcium 9.4 8.7 - 10.2 mg/dL   Total Protein 7.2 6.0 - 8.5 g/dL   Albumin 4.7 3.5 - 5.5 g/dL   Globulin, Total 2.5 1.5 - 4.5 g/dL   Albumin/Globulin Ratio 1.9 1.2 - 2.2   Bilirubin Total 0.5 0.0 - 1.2 mg/dL   Alkaline Phosphatase 63 39 - 117 IU/L   AST 21 0 - 40 IU/L   ALT 15 0 - 32 IU/L  Lipid Panel w/o Chol/HDL Ratio  Result Value Ref Range   Cholesterol, Total 230 (H) 100 -  199 mg/dL   Triglycerides 48 0 - 149 mg/dL   HDL 89 >08 mg/dL   VLDL Cholesterol Cal 10 5 - 40 mg/dL   LDL Calculated 657 (H) 0 - 99 mg/dL  TSH  Result Value Ref Range   TSH 6.940 (H) 0.450 - 4.500 uIU/mL  UA/M w/rflx Culture, Routine  Result Value Ref Range   Specific Gravity, UA 1.025 1.005 - 1.030   pH, UA 5.0 5.0 - 7.5   Color, UA Orange Yellow   Appearance Ur Clear Clear   Leukocytes, UA Negative Negative   Protein, UA Negative Negative/Trace   Glucose, UA Negative Negative   Ketones, UA 1+ (A) Negative   RBC, UA Negative Negative   Bilirubin, UA Negative Negative   Urobilinogen, Ur 0.2 0.2 - 1.0 mg/dL   Nitrite, UA Negative Negative      Assessment & Plan:   Problem List Items Addressed This Visit      Endocrine   Hypothyroidism - Primary    Recheck TSH, adjust as needed      Relevant Orders   TSH (Completed)    Other Visit Diagnoses    Annual physical exam       Relevant Orders   CBC with Differential/Platelet (Completed)   Comprehensive metabolic panel (Completed)   Lipid Panel w/o Chol/HDL Ratio (Completed)   UA/M w/rflx Culture, Routine (Completed)   History of gestational diabetes       Normal BMI, healthy lifestyle -  given pt concern will check Hg A1C periodically.    Relevant Orders   HgB A1c (Completed)       Follow up plan: Return in about 1 year (around 02/01/2019) for CPE.   LABORATORY TESTING:  - Pap smear: done elsewhere  IMMUNIZATIONS:   - Tdap: Tetanus vaccination status reviewed: last tetanus booster within 10 years. - Influenza: Up to date  PATIENT COUNSELING:   Advised to take 1 mg of folate supplement per day if capable of pregnancy.   Sexuality: Discussed sexually transmitted diseases, partner selection, use of condoms, avoidance of unintended pregnancy  and contraceptive alternatives.   Advised to avoid cigarette smoking.  I discussed with the patient that most people either abstain from alcohol or drink within safe limits  (<=14/week and <=4 drinks/occasion for males, <=7/weeks and <= 3 drinks/occasion for females) and that the risk for alcohol disorders and other health effects rises proportionally with the number of drinks per week and how often a drinker exceeds daily limits.  Discussed cessation/primary prevention of drug use and availability of treatment for abuse.   Diet: Encouraged to adjust caloric intake to maintain  or achieve ideal body weight, to reduce intake of dietary saturated fat and total fat, to limit sodium intake by avoiding high sodium foods and not adding table salt, and to maintain adequate dietary potassium and calcium preferably from fresh fruits, vegetables, and low-fat dairy products.    stressed the importance of regular exercise  Injury prevention: Discussed safety belts, safety helmets, smoke detector, smoking near bedding or upholstery.   Dental health: Discussed importance of regular tooth brushing, flossing, and dental visits.    NEXT PREVENTATIVE PHYSICAL DUE IN 1 YEAR. Return in about 1 year (around 02/01/2019) for CPE.

## 2018-02-01 ENCOUNTER — Other Ambulatory Visit: Payer: Self-pay | Admitting: Family Medicine

## 2018-02-01 DIAGNOSIS — E039 Hypothyroidism, unspecified: Secondary | ICD-10-CM

## 2018-02-01 LAB — LIPID PANEL W/O CHOL/HDL RATIO
Cholesterol, Total: 230 mg/dL — ABNORMAL HIGH (ref 100–199)
HDL: 89 mg/dL (ref >39)
LDL CALC: 131 mg/dL — AB (ref 0–99)
TRIGLYCERIDES: 48 mg/dL (ref 0–149)
VLDL Cholesterol Cal: 10 mg/dL (ref 5–40)

## 2018-02-01 LAB — COMPREHENSIVE METABOLIC PANEL
ALT: 15 IU/L (ref 0–32)
AST: 21 IU/L (ref 0–40)
Albumin/Globulin Ratio: 1.9 (ref 1.2–2.2)
Albumin: 4.7 g/dL (ref 3.5–5.5)
Alkaline Phosphatase: 63 IU/L (ref 39–117)
BUN/Creatinine Ratio: 12 (ref 9–23)
BUN: 9 mg/dL (ref 6–20)
Bilirubin Total: 0.5 mg/dL (ref 0.0–1.2)
CO2: 22 mmol/L (ref 20–29)
CREATININE: 0.73 mg/dL (ref 0.57–1.00)
Calcium: 9.4 mg/dL (ref 8.7–10.2)
Chloride: 100 mmol/L (ref 96–106)
GFR calc Af Amer: 120 mL/min/{1.73_m2} (ref >59)
GFR calc non Af Amer: 104 mL/min/{1.73_m2} (ref >59)
GLOBULIN, TOTAL: 2.5 g/dL (ref 1.5–4.5)
GLUCOSE: 84 mg/dL (ref 65–99)
Potassium: 3.6 mmol/L (ref 3.5–5.2)
Sodium: 141 mmol/L (ref 134–144)
Total Protein: 7.2 g/dL (ref 6.0–8.5)

## 2018-02-01 LAB — CBC WITH DIFFERENTIAL/PLATELET
BASOS: 1 %
Basophils Absolute: 0.1 10*3/uL (ref 0.0–0.2)
EOS (ABSOLUTE): 0.1 10*3/uL (ref 0.0–0.4)
Eos: 3 %
HEMATOCRIT: 41.7 % (ref 34.0–46.6)
Hemoglobin: 14 g/dL (ref 11.1–15.9)
Immature Grans (Abs): 0 10*3/uL (ref 0.0–0.1)
Immature Granulocytes: 0 %
LYMPHS ABS: 2.4 10*3/uL (ref 0.7–3.1)
Lymphs: 46 %
MCH: 28.5 pg (ref 26.6–33.0)
MCHC: 33.6 g/dL (ref 31.5–35.7)
MCV: 85 fL (ref 79–97)
MONOS ABS: 0.4 10*3/uL (ref 0.1–0.9)
Monocytes: 8 %
NEUTROS ABS: 2.2 10*3/uL (ref 1.4–7.0)
Neutrophils: 42 %
Platelets: 302 10*3/uL (ref 150–450)
RBC: 4.91 x10E6/uL (ref 3.77–5.28)
RDW: 13.6 % (ref 12.3–15.4)
WBC: 5.2 10*3/uL (ref 3.4–10.8)

## 2018-02-01 LAB — TSH: TSH: 6.94 u[IU]/mL — ABNORMAL HIGH (ref 0.450–4.500)

## 2018-02-01 LAB — HEMOGLOBIN A1C
Est. average glucose Bld gHb Est-mCnc: 111 mg/dL
Hgb A1c MFr Bld: 5.5 % (ref 4.8–5.6)

## 2018-02-01 MED ORDER — LEVOTHYROXINE SODIUM 88 MCG PO TABS: 88.0000 ug | ORAL_TABLET | Freq: Every day | ORAL | 0 refills | Status: DC

## 2018-02-03 NOTE — Assessment & Plan Note (Signed)
Recheck TSH, adjust as needed 

## 2018-05-25 ENCOUNTER — Other Ambulatory Visit: Payer: Self-pay | Admitting: Family Medicine

## 2018-05-25 MED ORDER — LEVOTHYROXINE SODIUM 88 MCG PO TABS: 88.0000 ug | ORAL_TABLET | Freq: Every day | ORAL | 0 refills | Status: DC

## 2018-05-25 NOTE — Telephone Encounter (Signed)
Copied from CRM [id number]. Topic: Quick Communication - Rx Refill/Question >> May 25, 2018 12:56 PM Baldo Daub L wrote: Medication: levothyroxine (SYNTHROID, LEVOTHROID) 88 MCG tablet  Has the patient contacted their pharmacy? Yes - no refills left (Agent: If no, request that the patient contact the pharmacy for the refill.) (Agent: If yes, when and what did the pharmacy advise?)  Preferred Pharmacy (with phone number or street name): [facility] - [place], [place] - [address] [id number] (Phone) [phone] (Fax)  Agent: Please be advised that RX refills may take up to 3 business days. We ask that you follow-up with your pharmacy.

## 2018-05-25 NOTE — Telephone Encounter (Signed)
Requested medication (s) are due for refill today:   Yes  Requested medication (s) are on the active medication list:   Yees  Future visit scheduled:   Yes 02/08/2019 with Evalyn Casco   Last ordered: 02/01/18  #90  0 refills  Medication failed protocol due to TSH not rechecked within 3 months after the dose was increased to 88 MCG. Information sent to provider for review for refills.   Requested Prescriptions  Pending Prescriptions Disp Refills   levothyroxine (SYNTHROID, LEVOTHROID) 88 MCG tablet 90 tablet 0    Sig: Take 1 tablet (88 mcg total) by mouth daily.     Endocrinology:  Hypothyroid Agents Failed - 05/25/2018  1:07 PM      Failed - TSH needs to be rechecked within 3 months after an abnormal result. Refill until TSH is due.      Failed - TSH in normal range and within 360 days    TSH  Date Value Ref Range Status  01/31/2018 6.940 (H) 0.450 - 4.500 uIU/mL Final         Passed - Valid encounter within last 12 months    Recent Outpatient Visits          3 months ago Hypothyroidism, unspecified type   [facility] [place], [place]   10 months ago Hypothyroidism, unspecified type   [facility], [place], [place]      Future Appointments            In 8 months Maurice March, [place], PA-C [facility], [facility]

## 2018-06-06 ENCOUNTER — Telehealth: Payer: Self-pay

## 2018-06-06 NOTE — Telephone Encounter (Signed)
Copied from CRM [id number]. Topic: General - Other >> Jun 06, 2018  2:52 PM Crist Infante wrote: Reason for CRM: pt instructed to return 6- 8 weeks for recheck lab appt on her cholesterol. Pt has scheduled for wed.  Can you put the order in for this lab?

## 2018-06-06 NOTE — Telephone Encounter (Signed)
Incoming CRM.  Fleet Contras, I see an order already in for TSH, but not Lipids.  You last wrote for patient's labs "I am sending in a higher dose of thyroid medication for you to try and you can come back in 6-8 weeks for a recheck lab visit. We will recheck cholesterol next time we see you just keep working on diet and exercise in the meantime".  Are we just checking her thyroid or her lipid panel as well? Patient coming in on 06/08/2018 for lab.

## 2018-06-06 NOTE — Telephone Encounter (Signed)
I certainly see the confusion in my wording - the lab visit was just about her thyroid levels as we are dose adjusting and what I meant from my message is that we would follow up on the cholesterol level at her next regular visit.

## 2018-06-07 NOTE — Telephone Encounter (Signed)
Called patient and notified her it was only a Thyroid panel and not cholesterol.  Patient understood.   Patient isn't scheduled for an appointment until 02/08/2019. Would you like patient to come in sooner than that for a F/U after Thyroid labs have been completed?

## 2018-06-08 ENCOUNTER — Other Ambulatory Visit: Payer: No Typology Code available for payment source

## 2018-06-08 ENCOUNTER — Other Ambulatory Visit: Payer: Self-pay

## 2018-06-08 DIAGNOSIS — E039 Hypothyroidism, unspecified: Secondary | ICD-10-CM

## 2018-06-09 LAB — TSH: TSH: 1.56 u[IU]/mL (ref 0.450–4.500)

## 2018-07-20 ENCOUNTER — Other Ambulatory Visit: Payer: Self-pay | Admitting: Family Medicine

## 2018-07-20 NOTE — Telephone Encounter (Signed)
Last TSH 1.560 on 05/25/18 Requested Prescriptions  Pending Prescriptions Disp Refills  . levothyroxine (SYNTHROID) 88 MCG tablet [Pharmacy Med Name: LEVOTHYROXINE 88 MCG TABLET 88 TAB] 30 tablet 0    Sig: TAKE 1 TABLET BY MOUTH DAILY. **NEED LABS RECHECK**     Endocrinology:  Hypothyroid Agents Failed - 07/20/2018 12:05 PM      Failed - TSH needs to be rechecked within 3 months after an abnormal result. Refill until TSH is due.      Passed - TSH in normal range and within 360 days    TSH  Date Value Ref Range Status  06/08/2018 1.560 0.450 - 4.500 uIU/mL Final         Passed - Valid encounter within last 12 months    Recent Outpatient Visits          5 months ago Hypothyroidism, unspecified type   [facility] [place], [place]   1 year ago Hypothyroidism, unspecified type   [facility], [place], [place]      Future Appointments            In 6 months Maurice March, [place], PA-C [facility], [facility]

## 2018-07-20 NOTE — Telephone Encounter (Signed)
Pt due for refill. Note attached to previous refill stated that pt needed lab work.   Last TSH 06/09/18 1.560.

## 2018-11-08 ENCOUNTER — Other Ambulatory Visit: Payer: Self-pay | Admitting: Family Medicine

## 2018-11-08 DIAGNOSIS — Z1231 Encounter for screening mammogram for malignant neoplasm of breast: Secondary | ICD-10-CM

## 2018-11-22 ENCOUNTER — Other Ambulatory Visit (HOSPITAL_COMMUNITY)
Admission: RE | Admit: 2018-11-22 | Discharge: 2018-11-22 | Disposition: A | Discharge status: Home / Self Care | Payer: No Typology Code available for payment source | Source: Ambulatory Visit | Attending: Obstetrics & Gynecology | Admitting: Obstetrics & Gynecology

## 2018-11-22 ENCOUNTER — Ambulatory Visit (INDEPENDENT_AMBULATORY_CARE_PROVIDER_SITE_OTHER): Payer: No Typology Code available for payment source | Admitting: Obstetrics & Gynecology

## 2018-11-22 ENCOUNTER — Encounter: Payer: Self-pay | Admitting: Obstetrics & Gynecology

## 2018-11-22 ENCOUNTER — Other Ambulatory Visit: Payer: Self-pay

## 2018-11-22 VITALS — BP 120/80 | Ht 64.5 in | Wt 145.0 lb

## 2018-11-22 DIAGNOSIS — Z01419 Encounter for gynecological examination (general) (routine) without abnormal findings: Secondary | ICD-10-CM

## 2018-11-22 DIAGNOSIS — Z124 Encounter for screening for malignant neoplasm of cervix: Secondary | ICD-10-CM

## 2018-11-22 DIAGNOSIS — Z1239 Encounter for other screening for malignant neoplasm of breast: Secondary | ICD-10-CM

## 2018-11-22 NOTE — Patient Instructions (Signed)
PAP every three years Mammogram every year    Call [phone] to schedule at [facility] yearly (with PCP)   

## 2018-11-22 NOTE — Progress Notes (Signed)
HPI:      Debbie Rivera is a 38 y.o. [phone] who LMP was No LMP recorded. (Menstrual status: IUD)., she presents today for her annual examination. The patient has no complaints today. The patient is sexually active. Her last pap: approximate date 2016 and was normal. The patient does perform self breast exams.  There is no notable family history of breast or ovarian cancer in her family.  The patient has regular exercise: yes.  The patient denies current symptoms of depression.    GYN History: Contraception: IUD year 4  PMHx: Past Medical History:  Diagnosis Date  . Gestational diabetes 2007  . Hypothyroidism    Past Surgical History:  Procedure Laterality Date  . BREAST REDUCTION SURGERY Bilateral   . BREAST SURGERY    . CESAREAN SECTION    . REDUCTION MAMMAPLASTY     Family History  Problem Relation Age of Onset  . Diabetes Mother   . Hypertension Maternal Grandfather    Social History   Tobacco Use  . Smoking status: Light Tobacco Smoker  . Smokeless tobacco: Never Used  Substance Use Topics  . Alcohol use: No  . Drug use: No    Current Outpatient Medications:  .  levonorgestrel (MIRENA) 20 MCG/24HR IUD, 1 each by Intrauterine route once., Disp: , Rfl:  .  levothyroxine (SYNTHROID) 88 MCG tablet, TAKE 1 TABLET BY MOUTH DAILY. **NEED LABS RECHECK**, Disp: 90 tablet, Rfl: 1 .  ibuprofen (ADVIL,MOTRIN) 600 MG tablet, Take 1 tablet (600 mg total) by mouth every 6 (six) hours as needed for moderate pain. (Patient not taking: Reported on 11/22/2018), Disp: 60 tablet, Rfl: 0 Allergies: Patient has no known allergies.  Review of Systems  Constitutional: Negative for chills, fever and malaise/fatigue.  HENT: Negative for congestion, sinus pain and sore throat.   Eyes: Negative for blurred vision and pain.  Respiratory: Negative for cough and wheezing.   Cardiovascular: Negative for chest pain and leg swelling.  Gastrointestinal: Negative for abdominal pain,  constipation, diarrhea, heartburn, nausea and vomiting.  Genitourinary: Negative for dysuria, frequency, hematuria and urgency.  Musculoskeletal: Negative for back pain, joint pain, myalgias and neck pain.  Skin: Negative for itching and rash.  Neurological: Negative for dizziness, tremors and weakness.  Endo/Heme/Allergies: Does not bruise/bleed easily.  Psychiatric/Behavioral: Negative for depression. The patient is not nervous/anxious and does not have insomnia.     Objective: BP 120/80   Ht 5' 4.5" (1.638 m)   Wt 145 lb (65.8 kg)   BMI 24.50 kg/m   Filed Weights   11/22/18 1436  Weight: 145 lb (65.8 kg)   Body mass index is 24.5 kg/m. Physical Exam Constitutional:      General: She is not in acute distress.    Appearance: She is well-developed.  Genitourinary:     Pelvic exam was performed with patient supine.     Vagina, uterus and rectum normal.     No lesions in the vagina.     No vaginal bleeding.     No cervical motion tenderness, friability, lesion or polyp.     Uterus is mobile.     Uterus is not enlarged.     No uterine mass detected.    Uterus is midaxial.     No right or left adnexal mass present.     Right adnexa not tender.     Left adnexa not tender.  HENT:     Head: Normocephalic and atraumatic. No laceration.  Right Ear: Hearing normal.     Left Ear: Hearing normal.     Mouth/Throat:     Pharynx: Uvula midline.  Eyes:     Pupils: Pupils are equal, round, and reactive to light.  Neck:     Musculoskeletal: Normal range of motion and neck supple.     Thyroid: No thyromegaly.  Cardiovascular:     Rate and Rhythm: Normal rate and regular rhythm.     Heart sounds: No murmur. No friction rub. No gallop.   Pulmonary:     Effort: Pulmonary effort is normal. No respiratory distress.     Breath sounds: Normal breath sounds. No wheezing.  Chest:     Breasts:        Right: No mass, skin change or tenderness.        Left: No mass, skin change or  tenderness.  Abdominal:     General: Bowel sounds are normal. There is no distension.     Palpations: Abdomen is soft.     Tenderness: There is no abdominal tenderness. There is no rebound.  Musculoskeletal: Normal range of motion.  Neurological:     Mental Status: She is alert and oriented to person, place, and time.     Cranial Nerves: No cranial nerve deficit.  Skin:    General: Skin is warm and dry.  Psychiatric:        Judgment: Judgment normal.  Vitals signs reviewed.     Assessment:  ANNUAL EXAM 1. Women's annual routine gynecological examination   2. Screening for breast cancer   3. Screening for cervical cancer     Screening Plan:            1.  Cervical Screening-  Pap smear done today  2. Breast screening- Exam annually and mammogram>40 planned   3. Labs managed by PCP   4. Counseling for contraception: IUD  Year 4     F/U  Return in about 1 year (around 11/22/2019) for Annual.  Debbie MajorPaul Roselyn Doby, MD, [facility] Frederick[facility], Debbie Rivera 11/22/2018  3:07 PM

## 2018-11-24 LAB — CYTOLOGY - PAP
Diagnosis: NEGATIVE
HPV: NOT DETECTED

## 2018-12-12 ENCOUNTER — Ambulatory Visit
Admission: RE | Admit: 2018-12-12 | Discharge: 2018-12-12 | Disposition: A | Discharge status: Home / Self Care | Payer: No Typology Code available for payment source | Source: Ambulatory Visit | Attending: Family Medicine | Admitting: Family Medicine

## 2018-12-12 ENCOUNTER — Other Ambulatory Visit: Payer: Self-pay

## 2018-12-12 DIAGNOSIS — Z1231 Encounter for screening mammogram for malignant neoplasm of breast: Secondary | ICD-10-CM | POA: Insufficient documentation

## 2018-12-13 ENCOUNTER — Other Ambulatory Visit: Payer: Self-pay | Admitting: Obstetrics & Gynecology

## 2018-12-13 NOTE — Progress Notes (Signed)
D/w patient Plan diagnostic imaging soon

## 2018-12-14 ENCOUNTER — Other Ambulatory Visit: Payer: Self-pay | Admitting: Family Medicine

## 2018-12-14 DIAGNOSIS — N6489 Other specified disorders of breast: Secondary | ICD-10-CM

## 2018-12-14 DIAGNOSIS — R921 Mammographic calcification found on diagnostic imaging of breast: Secondary | ICD-10-CM

## 2018-12-14 DIAGNOSIS — R928 Other abnormal and inconclusive findings on diagnostic imaging of breast: Secondary | ICD-10-CM

## 2018-12-21 ENCOUNTER — Ambulatory Visit
Admission: RE | Admit: 2018-12-21 | Discharge: 2018-12-21 | Disposition: A | Discharge status: Home / Self Care | Payer: No Typology Code available for payment source | Source: Ambulatory Visit | Attending: Family Medicine | Admitting: Family Medicine

## 2018-12-21 DIAGNOSIS — N6489 Other specified disorders of breast: Secondary | ICD-10-CM | POA: Insufficient documentation

## 2018-12-21 DIAGNOSIS — R928 Other abnormal and inconclusive findings on diagnostic imaging of breast: Secondary | ICD-10-CM | POA: Diagnosis not present

## 2018-12-21 DIAGNOSIS — R921 Mammographic calcification found on diagnostic imaging of breast: Secondary | ICD-10-CM

## 2019-02-06 ENCOUNTER — Other Ambulatory Visit: Payer: Self-pay

## 2019-02-08 ENCOUNTER — Other Ambulatory Visit: Payer: Self-pay

## 2019-02-08 ENCOUNTER — Encounter: Payer: No Typology Code available for payment source | Admitting: Family Medicine

## 2019-02-08 ENCOUNTER — Encounter: Payer: Self-pay | Admitting: Family Medicine

## 2019-02-08 ENCOUNTER — Ambulatory Visit (INDEPENDENT_AMBULATORY_CARE_PROVIDER_SITE_OTHER): Payer: No Typology Code available for payment source | Admitting: Family Medicine

## 2019-02-08 VITALS — BP 137/81 | HR 92 | Temp 98.2°F | Ht 64.0 in | Wt 142.0 lb

## 2019-02-08 DIAGNOSIS — Z0000 Encounter for general adult medical examination without abnormal findings: Secondary | ICD-10-CM

## 2019-02-08 DIAGNOSIS — E039 Hypothyroidism, unspecified: Secondary | ICD-10-CM

## 2019-02-08 LAB — UA/M W/RFLX CULTURE, ROUTINE
Bilirubin, UA: NEGATIVE
Glucose, UA: NEGATIVE
Ketones, UA: NEGATIVE
Leukocytes,UA: NEGATIVE
Nitrite, UA: NEGATIVE
Protein,UA: NEGATIVE
RBC, UA: NEGATIVE
Specific Gravity, UA: 1.015 (ref 1.005–1.030)
Urobilinogen, Ur: 0.2 mg/dL (ref 0.2–1.0)
pH, UA: 5.5 (ref 5.0–7.5)

## 2019-02-08 NOTE — Progress Notes (Signed)
BP 137/81   Pulse 92   Temp 98.2 F (36.8 C) (Oral)   Ht 5\' 4"  (1.626 m)   Wt 142 lb (64.4 kg)   SpO2 99%   BMI 24.37 kg/m    Subjective:    Patient ID: Debbie Rivera, female    DOB: 1981-02-25, 38 y.o.   MRN: [id number]  HPI: Debbie Rivera is a 38 y.o. female presenting on 02/08/2019 for comprehensive medical examination. Current medical complaints include:none  Hypothyroid, taking synthroid without issue. Asymptomatic, feeling very well.   She currently lives with: Menopausal Symptoms: no  Depression Screen done today and results listed below:  Depression screen [facility] 2/9 02/08/2019 01/31/2018 07/15/2017 05/22/2015  Decreased Interest 0 0 0 0  Down, Depressed, Hopeless 0 0 0 0  PHQ - 2 Score 0 0 0 0  Altered sleeping 0 - - -  Tired, decreased energy 0 - - -  Change in appetite 0 - - -  Feeling bad or failure about yourself  0 - - -  Trouble concentrating 0 - - -  Moving slowly or fidgety/restless 0 - - -  Suicidal thoughts 0 - - -  PHQ-9 Score 0 - - -    The patient does not have a history of falls. I did not complete a risk assessment for falls. A plan of care for falls was not documented.   Past Medical History:  Past Medical History:  Diagnosis Date  . Gestational diabetes 2007  . Hypothyroidism     Surgical History:  Past Surgical History:  Procedure Laterality Date  . BREAST REDUCTION SURGERY Bilateral   . BREAST SURGERY    . CESAREAN SECTION    . REDUCTION MAMMAPLASTY Bilateral 2009    Medications:  Current Outpatient Medications on File Prior to Visit  Medication Sig  . levonorgestrel (MIRENA) 20 MCG/24HR IUD 1 each by Intrauterine route once.  Marland Kitchen ibuprofen (ADVIL,MOTRIN) 600 MG tablet Take 1 tablet (600 mg total) by mouth every 6 (six) hours as needed for moderate pain. (Patient not taking: Reported on 11/22/2018)   No current facility-administered medications on file prior to visit.     Allergies:  No Known Allergies  Social History:   Social History   Socioeconomic History  . Marital status: Married    Spouse name: Not on file  . Number of children: Not on file  . Years of education: Not on file  . Highest education level: Not on file  Occupational History  . Not on file  Social Needs  . Financial resource strain: Not on file  . Food insecurity    Worry: Not on file    Inability: Not on file  . Transportation needs    Medical: Not on file    Non-medical: Not on file  Tobacco Use  . Smoking status: Light Tobacco Smoker  . Smokeless tobacco: Never Used  Substance and Sexual Activity  . Alcohol use: No  . Drug use: No  . Sexual activity: Yes    Birth control/protection: I.U.D.  Lifestyle  . Physical activity    Days per week: Not on file    Minutes per session: Not on file  . Stress: Not on file  Relationships  . Social Herbalist on phone: Not on file    Gets together: Not on file    Attends religious service: Not on file    Active member of club or organization: Not on file    Attends  meetings of clubs or organizations: Not on file    Relationship status: Not on file  . Intimate partner violence    Fear of current or ex partner: Not on file    Emotionally abused: Not on file    Physically abused: Not on file    Forced sexual activity: Not on file  Other Topics Concern  . Not on file  Social History Narrative  . Not on file   Social History   Tobacco Use  Smoking Status Light Tobacco Smoker  Smokeless Tobacco Never Used   Social History   Substance and Sexual Activity  Alcohol Use No    Family History:  Family History  Problem Relation Age of Onset  . Diabetes Mother   . Hypertension Maternal Grandfather     Past medical history, surgical history, medications, allergies, family history and social history reviewed with patient today and changes made to appropriate areas of the chart.   Review of Systems - General ROS: negative Psychological ROS: negative Ophthalmic  ROS: negative ENT ROS: negative Allergy and Immunology ROS: negative Hematological and Lymphatic ROS: negative Endocrine ROS: negative Breast ROS: negative for breast lumps Respiratory ROS: no cough, shortness of breath, or wheezing Cardiovascular ROS: no chest pain or dyspnea on exertion Gastrointestinal ROS: no abdominal pain, change in bowel habits, or black or bloody stools Genito-Urinary ROS: no dysuria, trouble voiding, or hematuria Musculoskeletal ROS: negative Neurological ROS: no TIA or stroke symptoms Dermatological ROS: negative All other ROS negative except what is listed above and in the HPI.      Objective:    BP 137/81   Pulse 92   Temp 98.2 F (36.8 C) (Oral)   Ht  (1.626 m)   Wt 142 lb (64.4 kg)   SpO2 99%   BMI 24.37 kg/m   Wt Readings from Last 3 Encounters:  02/08/19 142 lb (64.4 kg)  11/22/18 145 lb (65.8 kg)  01/31/18 133 lb 3 oz (60.4 kg)    Physical Exam Vitals signs and nursing note reviewed.  Constitutional:      General: She is not in acute distress.    Appearance: She is well-developed.  HENT:     Head: Atraumatic.     Right Ear: External ear normal.     Left Ear: External ear normal.     Nose: Nose normal.     Mouth/Throat:     Pharynx: No oropharyngeal exudate.  Eyes:     General: No scleral icterus.    Conjunctiva/sclera: Conjunctivae normal.     Pupils: Pupils are equal, round, and reactive to light.  Neck:     Musculoskeletal: Normal range of motion and neck supple.     Thyroid: No thyromegaly.  Cardiovascular:     Rate and Rhythm: Normal rate and regular rhythm.     Heart sounds: Normal heart sounds.  Pulmonary:     Effort: Pulmonary effort is normal. No respiratory distress.     Breath sounds: Normal breath sounds.  Chest:     Comments: Breast exam performed through GYN Abdominal:     General: Bowel sounds are normal.     Palpations: Abdomen is soft. There is no mass.     Tenderness: There is no abdominal  tenderness.  Genitourinary:    Comments: GU exam performed through GYN Musculoskeletal: Normal range of motion.        General: No tenderness.  Lymphadenopathy:     Cervical: No cervical adenopathy.  Skin:  General: Skin is warm and dry.     Findings: No rash.  Neurological:     Mental Status: She is alert and oriented to person, place, and time.     Cranial Nerves: No cranial nerve deficit.  Psychiatric:        Behavior: Behavior normal.     Results for orders placed or performed in visit on 02/08/19  CBC with Differential/Platelet out  Result Value Ref Range   WBC 5.4 3.4 - 10.8 x10E3/uL   RBC 5.18 3.77 - 5.28 x10E6/uL   Hemoglobin 15.2 11.1 - 15.9 g/dL   Hematocrit 93.7 34.2 - 46.6 %   MCV 87 79 - 97 fL   MCH 29.3 26.6 - 33.0 pg   MCHC 33.7 31.5 - 35.7 g/dL   RDW 87.6 81.1 - 57.2 %   Platelets 200 150 - 450 x10E3/uL   Neutrophils 51 Not Estab. %   Lymphs 40 Not Estab. %   Monocytes 6 Not Estab. %   Eos 2 Not Estab. %   Basos 1 Not Estab. %   Neutrophils Absolute 2.8 1.4 - 7.0 x10E3/uL   Lymphocytes Absolute 2.2 0.7 - 3.1 x10E3/uL   Monocytes Absolute 0.3 0.1 - 0.9 x10E3/uL   EOS (ABSOLUTE) 0.1 0.0 - 0.4 x10E3/uL   Basophils Absolute 0.0 0.0 - 0.2 x10E3/uL   Immature Granulocytes 0 Not Estab. %   Immature Grans (Abs) 0.0 0.0 - 0.1 x10E3/uL  Comprehensive metabolic panel  Result Value Ref Range   Glucose 94 65 - 99 mg/dL   BUN 13 6 - 24 mg/dL   Creatinine, Ser 6.20 0.57 - 1.00 mg/dL   GFR calc non Af Amer 100 >59 mL/min/1.73   GFR calc Af Amer 115 >59 mL/min/1.73   BUN/Creatinine Ratio 17 9 - 23   Sodium 141 134 - 144 mmol/L   Potassium 5.1 3.5 - 5.2 mmol/L   Chloride 102 96 - 106 mmol/L   CO2 23 20 - 29 mmol/L   Calcium 9.7 8.7 - 10.2 mg/dL   Total Protein 7.6 6.0 - 8.5 g/dL   Albumin 5.0 (H) 3.8 - 4.8 g/dL   Globulin, Total 2.6 1.5 - 4.5 g/dL   Albumin/Globulin Ratio 1.9 1.2 - 2.2   Bilirubin Total 0.2 0.0 - 1.2 mg/dL   Alkaline Phosphatase 103 39 -  117 IU/L   AST 30 0 - 40 IU/L   ALT 17 0 - 32 IU/L  Lipid Panel w/o Chol/HDL Ratio out  Result Value Ref Range   Cholesterol, Total 252 (H) 100 - 199 mg/dL   Triglycerides 63 0 - 149 mg/dL   HDL 355 >97 mg/dL   VLDL Cholesterol Cal 10 5 - 40 mg/dL   LDL Chol Calc (NIH) 416 (H) 0 - 99 mg/dL  TSH  Result Value Ref Range   TSH 0.466 0.450 - 4.500 uIU/mL  UA/M w/rflx Culture, Routine   Specimen: Urine   URINE  Result Value Ref Range   Specific Gravity, UA 1.015 1.005 - 1.030   pH, UA 5.5 5.0 - 7.5   Color, UA Yellow Yellow   Appearance Ur Clear Clear   Leukocytes,UA Negative Negative   Protein,UA Negative Negative/Trace   Glucose, UA Negative Negative   Ketones, UA Negative Negative   RBC, UA Negative Negative   Bilirubin, UA Negative Negative   Urobilinogen, Ur 0.2 0.2 - 1.0 mg/dL   Nitrite, UA Negative Negative      Assessment & Plan:   Problem List Items Addressed  This Visit      Endocrine   Hypothyroidism - Primary    Recheck TSH, adjust dose as needed. Continue current regimen      Relevant Medications   levothyroxine (SYNTHROID) 88 MCG tablet   Other Relevant Orders   TSH (Completed)    Other Visit Diagnoses    Annual physical exam       Relevant Orders   CBC with Differential/Platelet out (Completed)   Comprehensive metabolic panel (Completed)   Lipid Panel w/o Chol/HDL Ratio out (Completed)   UA/M w/rflx Culture, Routine (Completed)       Follow up plan: Return in about 1 year (around 02/08/2020) for CPE.   LABORATORY TESTING:  - Pap smear: up to date  IMMUNIZATIONS:   - Tdap: Tetanus vaccination status reviewed: last tetanus booster within 10 years. - Influenza: Up to date  SCREENING: -Mammogram: Up to date   PATIENT COUNSELING:   Advised to take 1 mg of folate supplement per day if capable of pregnancy.   Sexuality: Discussed sexually transmitted diseases, partner selection, use of condoms, avoidance of unintended pregnancy  and  contraceptive alternatives.   Advised to avoid cigarette smoking.  I discussed with the patient that most people either abstain from alcohol or drink within safe limits (<=14/week and <=4 drinks/occasion for males, <=7/weeks and <= 3 drinks/occasion for females) and that the risk for alcohol disorders and other health effects rises proportionally with the number of drinks per week and how often a drinker exceeds daily limits.  Discussed cessation/primary prevention of drug use and availability of treatment for abuse.   Diet: Encouraged to adjust caloric intake to maintain  or achieve ideal body weight, to reduce intake of dietary saturated fat and total fat, to limit sodium intake by avoiding high sodium foods and not adding table salt, and to maintain adequate dietary potassium and calcium preferably from fresh fruits, vegetables, and low-fat dairy products.    stressed the importance of regular exercise  Injury prevention: Discussed safety belts, safety helmets, smoke detector, smoking near bedding or upholstery.   Dental health: Discussed importance of regular tooth brushing, flossing, and dental visits.    NEXT PREVENTATIVE PHYSICAL DUE IN 1 YEAR. Return in about 1 year (around 02/08/2020) for CPE.

## 2019-02-09 LAB — COMPREHENSIVE METABOLIC PANEL
ALT: 17 IU/L (ref 0–32)
AST: 30 IU/L (ref 0–40)
Albumin/Globulin Ratio: 1.9 (ref 1.2–2.2)
Albumin: 5 g/dL — ABNORMAL HIGH (ref 3.8–4.8)
Alkaline Phosphatase: 103 IU/L (ref 39–117)
BUN/Creatinine Ratio: 17 (ref 9–23)
BUN: 13 mg/dL (ref 6–24)
Bilirubin Total: 0.2 mg/dL (ref 0.0–1.2)
CO2: 23 mmol/L (ref 20–29)
Calcium: 9.7 mg/dL (ref 8.7–10.2)
Chloride: 102 mmol/L (ref 96–106)
Creatinine, Ser: 0.75 mg/dL (ref 0.57–1.00)
GFR calc Af Amer: 115 mL/min/{1.73_m2} (ref >59)
GFR calc non Af Amer: 100 mL/min/{1.73_m2} (ref >59)
Globulin, Total: 2.6 g/dL (ref 1.5–4.5)
Glucose: 94 mg/dL (ref 65–99)
Potassium: 5.1 mmol/L (ref 3.5–5.2)
Sodium: 141 mmol/L (ref 134–144)
Total Protein: 7.6 g/dL (ref 6.0–8.5)

## 2019-02-09 LAB — CBC WITH DIFFERENTIAL/PLATELET
Basophils Absolute: 0 10*3/uL (ref 0.0–0.2)
Basos: 1 %
EOS (ABSOLUTE): 0.1 10*3/uL (ref 0.0–0.4)
Eos: 2 %
Hematocrit: 45.1 % (ref 34.0–46.6)
Hemoglobin: 15.2 g/dL (ref 11.1–15.9)
Immature Grans (Abs): 0 10*3/uL (ref 0.0–0.1)
Immature Granulocytes: 0 %
Lymphocytes Absolute: 2.2 10*3/uL (ref 0.7–3.1)
Lymphs: 40 %
MCH: 29.3 pg (ref 26.6–33.0)
MCHC: 33.7 g/dL (ref 31.5–35.7)
MCV: 87 fL (ref 79–97)
Monocytes Absolute: 0.3 10*3/uL (ref 0.1–0.9)
Monocytes: 6 %
Neutrophils Absolute: 2.8 10*3/uL (ref 1.4–7.0)
Neutrophils: 51 %
Platelets: 200 10*3/uL (ref 150–450)
RBC: 5.18 x10E6/uL (ref 3.77–5.28)
RDW: 13.2 % (ref 11.7–15.4)
WBC: 5.4 10*3/uL (ref 3.4–10.8)

## 2019-02-09 LAB — TSH: TSH: 0.466 u[IU]/mL (ref 0.450–4.500)

## 2019-02-09 LAB — LIPID PANEL W/O CHOL/HDL RATIO
Cholesterol, Total: 252 mg/dL — ABNORMAL HIGH (ref 100–199)
HDL: 112 mg/dL (ref >39)
LDL Chol Calc (NIH): 130 mg/dL — ABNORMAL HIGH (ref 0–99)
Triglycerides: 63 mg/dL (ref 0–149)
VLDL Cholesterol Cal: 10 mg/dL (ref 5–40)

## 2019-02-13 MED ORDER — LEVOTHYROXINE SODIUM 88 MCG PO TABS: ORAL_TABLET | ORAL | 1 refills | Status: DC

## 2019-02-13 NOTE — Assessment & Plan Note (Signed)
Recheck TSH, adjust dose as needed. Continue current regimen.  

## 2019-07-25 ENCOUNTER — Other Ambulatory Visit: Payer: Self-pay

## 2019-07-25 ENCOUNTER — Telehealth: Payer: Self-pay | Admitting: Family Medicine

## 2019-07-25 DIAGNOSIS — R928 Other abnormal and inconclusive findings on diagnostic imaging of breast: Secondary | ICD-10-CM

## 2019-07-25 NOTE — Telephone Encounter (Signed)
Called pt and let her know she can schedule appt anytime, the orders are in.

## 2019-07-25 NOTE — Telephone Encounter (Signed)
Copied from CRM [id number]. Topic: Referral - Request for Referral >> Jul 25, 2019  8:52 AM Tamela Oddi wrote: Has patient seen PCP for this complaint? yes *If NO, is insurance requiring patient see PCP for this issue before PCP can refer them? Referral for which specialty: [facility] Preferred provider/office: [facility] 6 month follow up Reason for referral: Abnormal breast exam

## 2019-07-26 ENCOUNTER — Other Ambulatory Visit: Payer: Self-pay | Admitting: Family Medicine

## 2019-07-26 DIAGNOSIS — N6489 Other specified disorders of breast: Secondary | ICD-10-CM

## 2019-08-23 ENCOUNTER — Ambulatory Visit
Admission: RE | Admit: 2019-08-23 | Discharge: 2019-08-23 | Disposition: A | Discharge status: Home / Self Care | Payer: No Typology Code available for payment source | Source: Ambulatory Visit | Attending: Family Medicine | Admitting: Family Medicine

## 2019-08-23 DIAGNOSIS — N6489 Other specified disorders of breast: Secondary | ICD-10-CM | POA: Insufficient documentation

## 2019-11-23 ENCOUNTER — Ambulatory Visit (INDEPENDENT_AMBULATORY_CARE_PROVIDER_SITE_OTHER): Payer: No Typology Code available for payment source | Admitting: Obstetrics & Gynecology

## 2019-11-23 ENCOUNTER — Encounter: Payer: Self-pay | Admitting: Obstetrics & Gynecology

## 2019-11-23 ENCOUNTER — Other Ambulatory Visit: Payer: Self-pay

## 2019-11-23 VITALS — BP 120/80 | Ht 64.5 in | Wt 144.0 lb

## 2019-11-23 DIAGNOSIS — Z01419 Encounter for gynecological examination (general) (routine) without abnormal findings: Secondary | ICD-10-CM | POA: Diagnosis not present

## 2019-11-23 DIAGNOSIS — Z1231 Encounter for screening mammogram for malignant neoplasm of breast: Secondary | ICD-10-CM | POA: Diagnosis not present

## 2019-11-23 DIAGNOSIS — Z30433 Encounter for removal and reinsertion of intrauterine contraceptive device: Secondary | ICD-10-CM | POA: Diagnosis not present

## 2019-11-23 NOTE — Patient Instructions (Signed)
PAP every three years Mammogram every year, due in late Sept    Call [phone] to schedule at [facility] yearly (with PCP)  Thank you for choosing [facility]. As part of our ongoing efforts to improve patient experience, we would appreciate your feedback. Please fill out the short survey that you will receive by mail or MyChart. Your opinion is important to [place]! - Dr. Tiburcio Pea

## 2019-11-23 NOTE — Progress Notes (Signed)
HPI:      [name] is a 39 y.o. [phone] who LMP was No LMP recorded. (Menstrual status: IUD)., she presents today for her annual examination. The patient has no complaints today. The patient is sexually active. Her last pap: approximate date 2020 and was normal and last mammogram: approximate date 2020 and was normal after follow up [facility].  The patient does perform self breast exams.  There is no notable family history of breast or ovarian cancer in her family.  The patient has regular exercise: yes.  The patient denies current symptoms of depression.    GYN History: Contraception: IUD Mirena year 5  PMHx: Past Medical History:  Diagnosis Date  . Gestational diabetes 2007  . Hypothyroidism    Past Surgical History:  Procedure Laterality Date  . BREAST REDUCTION SURGERY Bilateral   . BREAST SURGERY    . CESAREAN SECTION    . REDUCTION MAMMAPLASTY Bilateral 2009   Family History  Problem Relation Age of Onset  . Diabetes Mother   . Hypertension Maternal Grandfather    Social History   Tobacco Use  . Smoking status: Light Tobacco Smoker  . Smokeless tobacco: Never Used  Vaping Use  . Vaping Use: Never used  Substance Use Topics  . Alcohol use: No  . Drug use: No    Current Outpatient Medications:  .  levonorgestrel (MIRENA) 20 MCG/24HR IUD, 1 each by Intrauterine route once., Disp: , Rfl:  .  levothyroxine (SYNTHROID) 88 MCG tablet, TAKE 1 TABLET BY MOUTH DAILY., Disp: 90 tablet, Rfl: 1 .  ibuprofen (ADVIL,MOTRIN) 600 MG tablet, Take 1 tablet (600 mg total) by mouth every 6 (six) hours as needed for moderate pain. (Patient not taking: Reported on 11/22/2018), Disp: 60 tablet, Rfl: 0 Allergies: Patient has no known allergies.  Review of Systems  Constitutional: Negative for chills, fever and malaise/fatigue.  HENT: Negative for congestion, sinus pain and sore throat.   Eyes: Negative for blurred vision and pain.  Respiratory: Negative for cough and  wheezing.   Cardiovascular: Negative for chest pain and leg swelling.  Gastrointestinal: Negative for abdominal pain, constipation, diarrhea, heartburn, nausea and vomiting.  Genitourinary: Negative for dysuria, frequency, hematuria and urgency.  Musculoskeletal: Negative for back pain, joint pain, myalgias and neck pain.  Skin: Negative for itching and rash.  Neurological: Negative for dizziness, tremors and weakness.  Endo/Heme/Allergies: Does not bruise/bleed easily.  Psychiatric/Behavioral: Negative for depression. The patient is not nervous/anxious and does not have insomnia.     Objective: BP 120/80   Ht 5' 4.5" (1.638 m)   Wt 144 lb (65.3 kg)   BMI 24.34 kg/m   Filed Weights   11/23/19 0928  Weight: 144 lb (65.3 kg)   Body mass index is 24.34 kg/m. Physical Exam Constitutional:      General: She is not in acute distress.    Appearance: She is well-developed.  Genitourinary:     Pelvic exam was performed with patient supine.     Urethra, bladder, vagina, uterus and rectum normal.     No lesions in the vagina.     No vaginal bleeding.     No cervical motion tenderness, friability, lesion or polyp.     IUD strings visualized.     Uterus is mobile.     Uterus is not enlarged.     No uterine mass detected.    Uterus is midaxial.     No right or left adnexal mass present.  Right adnexa not tender.     Left adnexa not tender.  HENT:     Head: Normocephalic and atraumatic. No laceration.     Right Ear: Hearing normal.     Left Ear: Hearing normal.     Mouth/Throat:     Pharynx: Uvula midline.  Eyes:     Pupils: Pupils are equal, round, and reactive to light.  Neck:     Thyroid: No thyromegaly.  Cardiovascular:     Rate and Rhythm: Normal rate and regular rhythm.     Heart sounds: No murmur heard.  No friction rub. No gallop.   Pulmonary:     Effort: Pulmonary effort is normal. No respiratory distress.     Breath sounds: Normal breath sounds. No wheezing.    Chest:     Breasts:        Right: No mass, skin change or tenderness.        Left: No mass, skin change or tenderness.  Abdominal:     General: Bowel sounds are normal. There is no distension.     Palpations: Abdomen is soft.     Tenderness: There is no abdominal tenderness. There is no rebound.  Musculoskeletal:        General: Normal range of motion.     Cervical back: Normal range of motion and neck supple.  Neurological:     Mental Status: She is alert and oriented to person, place, and time.     Cranial Nerves: No cranial nerve deficit.  Skin:    General: Skin is warm and dry.  Psychiatric:        Judgment: Judgment normal.  Vitals reviewed.     Assessment:  ANNUAL EXAM 1. Women's annual routine gynecological examination   2. Encounter for screening mammogram for malignant neoplasm of breast   3. Encounter for removal and reinsertion of intrauterine contraceptive device               1.  Cervical Screening-  Pap smear schedule reviewed with patient  2. Breast screening- Exam annually and mammogram>40 planned   3. Labs managed by PCP  4. Counseling for contraception: IUD   5. Encounter for removal and reinsertion of intrauterine contraceptive device See below  IUD NOTE: History of Present Illness:  Debbie Rivera is a 39 y.o. that had a Mirena IUD placed approximately 5 years ago. Since that time, she states that has rare period and no pregnancy worries.  Does not desire future fertility.  Pelvic exam:  Two IUD strings present seen coming from the cervical os. EGBUS, vaginal vault and cervix: within normal limits  IUD Removal Strings of IUD identified and grasped.  IUD removed without problem.  Pt tolerated this well.  IUD noted to be intact.   IUD PROCEDURE NOTE:  Debbie Rivera is a 39 y.o. [phone] here for IUD insertion. No GYN concerns.  Last pap smear was normal.  IUD Insertion Procedure Note Patient identified, informed consent performed, consent  signed.   Discussed risks of irregular bleeding, cramping, infection, malpositioning or misplacement of the IUD outside the uterus which may require further procedure such as laparoscopy, risk of failure <1%. Time out was performed.  Urine pregnancy test negative.  A bimanual exam showed the uterus to be retroverted.  Speculum placed in the vagina.  Cervix visualized.  Cleaned with Betadine x 2.  Grasped anteriorly with a single tooth tenaculum.  Uterus sounded to 7 cm.   Mirena IUD placed per  manufacturer's recommendations.  Strings trimmed to 3 cm. Tenaculum was removed, good hemostasis noted.  Patient tolerated procedure well.   Patient was given post-procedure instructions.  She was advised to have backup contraception for one week.  Patient was also asked to check IUD strings periodically and follow up in 4 weeks for IUD check.     F/U  Return in about 4 weeks (around 12/21/2019) for Follow up (IUD).  Annamarie Major, MD, [facility], [facility] 11/23/2019  9:55 AM

## 2019-12-21 ENCOUNTER — Ambulatory Visit (INDEPENDENT_AMBULATORY_CARE_PROVIDER_SITE_OTHER): Payer: No Typology Code available for payment source | Admitting: Obstetrics & Gynecology

## 2019-12-21 ENCOUNTER — Other Ambulatory Visit: Payer: Self-pay

## 2019-12-21 ENCOUNTER — Encounter: Payer: Self-pay | Admitting: Obstetrics & Gynecology

## 2019-12-21 VITALS — BP 120/80 | Ht 64.5 in | Wt 147.0 lb

## 2019-12-21 DIAGNOSIS — Z30431 Encounter for routine checking of intrauterine contraceptive device: Secondary | ICD-10-CM

## 2019-12-21 NOTE — Progress Notes (Signed)
  History of Present Illness:  Debbie Rivera is a 39 y.o. that had a Mirena IUD placed approximately 1 month ago. Since that time, she states that she has had no bleeding and pain  PMHx: She  has a past medical history of Gestational diabetes (2007) and Hypothyroidism. Also,  has a past surgical history that includes Cesarean section; Breast reduction surgery (Bilateral); Breast surgery; and Reduction mammaplasty (Bilateral, 2009)., family history includes Diabetes in her mother; Hypertension in her maternal grandfather.,  reports that she has been smoking. She has never used smokeless tobacco. She reports that she does not drink alcohol and does not use drugs. Current Meds  Medication Sig  . ibuprofen (ADVIL,MOTRIN) 600 MG tablet Take 1 tablet (600 mg total) by mouth every 6 (six) hours as needed for moderate pain.  Marland Kitchen levonorgestrel (MIRENA) 20 MCG/24HR IUD 1 each by Intrauterine route once.  Marland Kitchen levothyroxine (SYNTHROID) 88 MCG tablet TAKE 1 TABLET BY MOUTH DAILY.  Marland Kitchen  Also, has No Known Allergies..  Review of Systems  All other systems reviewed and are negative.   Physical Exam:  BP 120/80   Ht 5' 4.5" (1.638 m)   Wt 147 lb (66.7 kg)   BMI 24.84 kg/m  Body mass index is 24.84 kg/m. Constitutional: Well nourished, well developed female in no acute distress.  Abdomen: diffusely non tender to palpation, non distended, and no masses, hernias Neuro: Grossly intact Psych:  Normal mood and affect.    Pelvic exam:  Two IUD strings present seen coming from the cervical os. EGBUS, vaginal vault and cervix: within normal limits  Assessment: IUD strings present in proper location; pt doing well  Plan: She was told to continue to use barrier contraception, in order to prevent any STIs, and to take a home pregnancy test or call us if she ever thinks she may be pregnant, and that her IUD expires in 6 years.  She was amenable to this plan and we will see her back in 1 year/PRN.  A total of 20  minutes were spent face-to-face with the patient as well as preparation, review, communication, and documentation during this encounter.   Annamarie Major, MD, [facility], [facility] 12/21/2019  10:34 AM

## 2019-12-28 ENCOUNTER — Other Ambulatory Visit: Payer: Self-pay | Admitting: Family Medicine

## 2019-12-28 NOTE — Telephone Encounter (Signed)
Prescription expires in 2 months. 

## 2020-02-08 ENCOUNTER — Encounter: Payer: No Typology Code available for payment source | Admitting: Unknown Physician Specialty

## 2020-02-08 ENCOUNTER — Encounter: Payer: No Typology Code available for payment source | Admitting: Family Medicine

## 2020-02-26 ENCOUNTER — Ambulatory Visit (INDEPENDENT_AMBULATORY_CARE_PROVIDER_SITE_OTHER): Payer: No Typology Code available for payment source | Admitting: Nurse Practitioner

## 2020-02-26 ENCOUNTER — Other Ambulatory Visit: Payer: Self-pay

## 2020-02-26 ENCOUNTER — Encounter: Payer: Self-pay | Admitting: Nurse Practitioner

## 2020-02-26 VITALS — BP 129/85 | HR 85 | Temp 98.6°F | Ht 63.98 in | Wt 150.5 lb

## 2020-02-26 DIAGNOSIS — E038 Other specified hypothyroidism: Secondary | ICD-10-CM | POA: Diagnosis not present

## 2020-02-26 DIAGNOSIS — Z0000 Encounter for general adult medical examination without abnormal findings: Secondary | ICD-10-CM

## 2020-02-26 DIAGNOSIS — Z1159 Encounter for screening for other viral diseases: Secondary | ICD-10-CM | POA: Diagnosis not present

## 2020-02-26 DIAGNOSIS — Z13220 Encounter for screening for lipoid disorders: Secondary | ICD-10-CM

## 2020-02-26 DIAGNOSIS — Z136 Encounter for screening for cardiovascular disorders: Secondary | ICD-10-CM

## 2020-02-26 NOTE — Patient Instructions (Signed)
Preventive Care 41-41 Years Old, Female Preventive care refers to visits with your health care provider and lifestyle choices that can promote health and wellness. This includes:  A yearly physical exam. This may also be called an annual well check.  Regular dental visits and eye exams.  Immunizations.  Screening for certain conditions.  Healthy lifestyle choices, such as eating a healthy diet, getting regular exercise, not using drugs or products that contain nicotine and tobacco, and limiting alcohol use. What can I expect for my preventive care visit? Physical exam Your health care provider will check your:  Height and weight. This may be used to calculate body mass index (BMI), which tells if you are at a healthy weight.  Heart rate and blood pressure.  Skin for abnormal spots. Counseling Your health care provider may ask you questions about your:  Alcohol, tobacco, and drug use.  Emotional well-being.  Home and relationship well-being.  Sexual activity.  Eating habits.  Work and work environment.  Method of birth control.  Menstrual cycle.  Pregnancy history. What immunizations do I need?  Influenza (flu) vaccine  This is recommended every year. Tetanus, diphtheria, and pertussis (Tdap) vaccine  You may need a Td booster every 10 years. Varicella (chickenpox) vaccine  You may need this if you have not been vaccinated. Zoster (shingles) vaccine  You may need this after age 41. Measles, mumps, and rubella (MMR) vaccine  You may need at least one dose of MMR if you were born in 1957 or later. You may also need a second dose. Pneumococcal conjugate (PCV13) vaccine  You may need this if you have certain conditions and were not previously vaccinated. Pneumococcal polysaccharide (PPSV23) vaccine  You may need one or two doses if you smoke cigarettes or if you have certain conditions. Meningococcal conjugate (MenACWY) vaccine  You may need this if you  have certain conditions. Hepatitis A vaccine  You may need this if you have certain conditions or if you travel or work in places where you may be exposed to hepatitis A. Hepatitis B vaccine  You may need this if you have certain conditions or if you travel or work in places where you may be exposed to hepatitis B. Haemophilus influenzae type b (Hib) vaccine  You may need this if you have certain conditions. Human papillomavirus (HPV) vaccine  If recommended by your health care provider, you may need three doses over 6 months. You may receive vaccines as individual doses or as more than one vaccine together in one shot (combination vaccines). Talk with your health care provider about the risks and benefits of combination vaccines. What tests do I need? Blood tests  Lipid and cholesterol levels. These may be checked every 5 years, or more frequently if you are over 41 years old.  Hepatitis C test.  Hepatitis B test. Screening  Lung cancer screening. You may have this screening every year starting at age 41 if you have a 30-pack-year history of smoking and currently smoke or have quit within the past 15 years.  Colorectal cancer screening. All adults should have this screening starting at age 41 and continuing until age 41. Your health care provider may recommend screening at age 41 if you are at increased risk. You will have tests every 1-10 years, depending on your results and the type of screening test.  Diabetes screening. This is done by checking your blood sugar (glucose) after you have not eaten for a while (fasting). You may have this   done every 1-3 years.  Mammogram. This may be done every 1-2 years. Talk with your health care provider about when you should start having regular mammograms. This may depend on whether you have a family history of breast cancer.  BRCA-related cancer screening. This may be done if you have a family history of breast, ovarian, tubal, or peritoneal  cancers.  Pelvic exam and Pap test. This may be done every 3 years starting at age 41. Starting at age 41, this may be done every 5 years if you have a Pap test in combination with an HPV test. Other tests  Sexually transmitted disease (STD) testing.  Bone density scan. This is done to screen for osteoporosis. You may have this scan if you are at high risk for osteoporosis. Follow these instructions at home: Eating and drinking  Eat a diet that includes fresh fruits and vegetables, whole grains, lean protein, and low-fat dairy.  Take vitamin and mineral supplements as recommended by your health care provider.  Do not drink alcohol if: ? Your health care provider tells you not to drink. ? You are pregnant, may be pregnant, or are planning to become pregnant.  If you drink alcohol: ? Limit how much you have to 0-1 drink a day. ? Be aware of how much alcohol is in your drink. In the [place] one drink equals one 12 oz bottle of beer (355 mL), one 5 oz glass of wine (148 mL), or one 1 oz glass of hard liquor (44 mL). Lifestyle  Take daily care of your teeth and gums.  Stay active. Exercise for at least 30 minutes on 5 or more days each week.  Do not use any products that contain nicotine or tobacco, such as cigarettes, e-cigarettes, and chewing tobacco. If you need help quitting, ask your health care provider.  If you are sexually active, practice safe sex. Use a condom or other form of birth control (contraception) in order to prevent pregnancy and STIs (sexually transmitted infections).  If told by your health care provider, take low-dose aspirin daily starting at age 41. What's next?  Visit your health care provider once a year for a well check visit.  Ask your health care provider how often you should have your eyes and teeth checked.  Stay up to date on all vaccines. This information is not intended to replace advice given to you by your health care provider. Make sure you  discuss any questions you have with your health care provider. Document Revised: 11/25/2017 Document Reviewed: 11/25/2017 Elsevier Patient Education  2020 [employer].

## 2020-02-26 NOTE — Progress Notes (Signed)
BP 129/85   Pulse 85   Temp 98.6 F (37 C)   Ht 5' 3.98" (1.625 m)   Wt 150 lb 8 oz (68.3 kg)   SpO2 97%   BMI 25.85 kg/m    Subjective:    Patient ID: Debbie Rivera, female    DOB: [birth year], 39 y.o.   MRN: [id number]  HPI: Debbie Rivera is a 39 y.o. female presenting on 02/26/2020 for comprehensive medical examination. Current medical complaints include: none  She currently lives with: husband and 3 boys LMP: IUD replaced in August; no periods  Depression Screen done today and results listed below:  Depression screen [facility] 2/9 02/26/2020 02/08/2019 01/31/2018 07/15/2017 05/22/2015  Decreased Interest 0 0 0 0 0  Down, Depressed, Hopeless 0 0 0 0 0  PHQ - 2 Score 0 0 0 0 0  Altered sleeping - 0 - - -  Tired, decreased energy - 0 - - -  Change in appetite - 0 - - -  Feeling bad or failure about yourself  - 0 - - -  Trouble concentrating - 0 - - -  Moving slowly or fidgety/restless - 0 - - -  Suicidal thoughts - 0 - - -  PHQ-9 Score - 0 - - -   The patient does not have a history of falls. I did not complete a risk assessment for falls. A plan of care for falls was not documented.   Past Medical History:  Past Medical History:  Diagnosis Date  . Gestational diabetes 2007  . Hypothyroidism     Surgical History:  Past Surgical History:  Procedure Laterality Date  . BREAST REDUCTION SURGERY Bilateral   . BREAST SURGERY    . CESAREAN SECTION    . REDUCTION MAMMAPLASTY Bilateral 2009    Medications:  Current Outpatient Medications on File Prior to Visit  Medication Sig  . ibuprofen (ADVIL,MOTRIN) 600 MG tablet Take 1 tablet (600 mg total) by mouth every 6 (six) hours as needed for moderate pain.  Marland Kitchen levonorgestrel (MIRENA) 20 MCG/24HR IUD 1 each by Intrauterine route once.  Marland Kitchen levothyroxine (SYNTHROID) 88 MCG tablet TAKE 1 TABLET BY MOUTH DAILY.   No current facility-administered medications on file prior to visit.    Allergies:  No Known Allergies  Social  History:  Social History   Socioeconomic History  . Marital status: Married    Spouse name: Not on file  . Number of children: Not on file  . Years of education: Not on file  . Highest education level: Not on file  Occupational History  . Not on file  Tobacco Use  . Smoking status: Former Smoker    Quit date: 10/13/2018    Years since quitting: 1.3  . Smokeless tobacco: Never Used  Vaping Use  . Vaping Use: Never used  Substance and Sexual Activity  . Alcohol use: No  . Drug use: No  . Sexual activity: Yes    Birth control/protection: I.U.D.  Other Topics Concern  . Not on file  Social History Narrative  . Not on file   Social Determinants of Health   Financial Resource Strain:   . Difficulty of Paying Living Expenses: Not on file  Food Insecurity:   . Worried About Programme researcher, broadcasting/film/video in the Last Year: Not on file  . Ran Out of Food in the Last Year: Not on file  Transportation Needs:   . Lack of Transportation (Medical): Not on file  . Lack  of Transportation (Non-Medical): Not on file  Physical Activity:   . Days of Exercise per Week: Not on file  . Minutes of Exercise per Session: Not on file  Stress:   . Feeling of Stress : Not on file  Social Connections:   . Frequency of Communication with Friends and Family: Not on file  . Frequency of Social Gatherings with Friends and Family: Not on file  . Attends Religious Services: Not on file  . Active Member of Clubs or Organizations: Not on file  . Attends Banker Meetings: Not on file  . Marital Status: Not on file  Intimate Partner Violence:   . Fear of Current or Ex-Partner: Not on file  . Emotionally Abused: Not on file  . Physically Abused: Not on file  . Sexually Abused: Not on file   Social History   Tobacco Use  Smoking Status Former Smoker  . Quit date: 10/13/2018  . Years since quitting: 1.3  Smokeless Tobacco Never Used   Social History   Substance and Sexual Activity  Alcohol Use  No    Family History:  Family History  Problem Relation Age of Onset  . Diabetes Mother   . Hypertension Maternal Grandfather     Past medical history, surgical history, medications, allergies, family history and social history reviewed with patient today and changes made to appropriate areas of the chart.   Review of Systems  Constitutional: Negative.  Negative for fever.  HENT: Negative.  Negative for congestion, ear discharge, ear pain, sinus pain and sore throat.   Eyes: Negative.  Negative for blurred vision and double vision.  Respiratory: Negative.  Negative for cough, shortness of breath and wheezing.   Cardiovascular: Negative.  Negative for chest pain, palpitations and leg swelling.  Gastrointestinal: Negative.  Negative for blood in stool, constipation, diarrhea, heartburn, nausea and vomiting.  Genitourinary: Negative.  Negative for dysuria and hematuria.  Musculoskeletal: Negative.  Negative for joint pain and myalgias.  Skin: Negative.  Negative for itching and rash.  Neurological: Negative.  Negative for dizziness, tingling, weakness and headaches.  Psychiatric/Behavioral: Negative.  Negative for depression and suicidal ideas. The patient is not nervous/anxious and does not have insomnia.    All other ROS negative except what is listed above and in the HPI.      Objective:    BP 129/85   Pulse 85   Temp 98.6 F (37 C)   Ht 5' 3.98" (1.625 m)   Wt 150 lb 8 oz (68.3 kg)   SpO2 97%   BMI 25.85 kg/m   Wt Readings from Last 3 Encounters:  02/26/20 150 lb 8 oz (68.3 kg)  12/21/19 147 lb (66.7 kg)  11/23/19 144 lb (65.3 kg)    Physical Exam Vitals and nursing note reviewed. Exam conducted with a chaperone present.  Constitutional:      General: She is not in acute distress.    Appearance: Normal appearance. She is normal weight. She is not ill-appearing or toxic-appearing.  HENT:     Head: Normocephalic and atraumatic.     Right Ear: Tympanic membrane, ear  canal and external ear normal.     Left Ear: Tympanic membrane, ear canal and external ear normal.     Nose: Nose normal. No congestion or rhinorrhea.     Mouth/Throat:     Mouth: Mucous membranes are moist.     Pharynx: Oropharynx is clear. No oropharyngeal exudate.  Eyes:     General: No scleral  icterus.    Extraocular Movements: Extraocular movements intact.     Pupils: Pupils are equal, round, and reactive to light.  Cardiovascular:     Rate and Rhythm: Normal rate and regular rhythm.     Pulses: Normal pulses.     Heart sounds: Normal heart sounds. No murmur heard.   Pulmonary:     Effort: Pulmonary effort is normal. No respiratory distress.     Breath sounds: No wheezing or rhonchi.  Chest:     Breasts:        Right: Normal. No inverted nipple, mass, nipple discharge or skin change.        Left: Normal. No inverted nipple, mass, nipple discharge or skin change.     Comments: Bilateral scar inferior to nipple in 6 o'clock position secondary to breast reduction Abdominal:     General: Abdomen is flat. Bowel sounds are normal. There is no distension.     Palpations: Abdomen is soft.     Tenderness: There is no abdominal tenderness.  Genitourinary:    Comments: GU exam deferred; completed with OB/GYN in August Musculoskeletal:        General: No swelling or tenderness. Normal range of motion.     Cervical back: Normal range of motion and neck supple. No rigidity or tenderness.     Right lower leg: No edema.     Left lower leg: No edema.  Lymphadenopathy:     Upper Body:     Right upper body: No axillary adenopathy.     Left upper body: No axillary adenopathy.  Skin:    General: Skin is warm and dry.     Capillary Refill: Capillary refill takes less than 2 seconds.     Coloration: Skin is not jaundiced or pale.  Neurological:     General: No focal deficit present.     Mental Status: She is alert and oriented to person, place, and time.     Motor: No weakness.     Gait:  Gait normal.  Psychiatric:        Mood and Affect: Mood normal.        Behavior: Behavior normal.        Thought Content: Thought content normal.        Judgment: Judgment normal.       Assessment & Plan:   Problem List Items Addressed This Visit      Endocrine   Hypothyroidism    Chronic, stable.  Will check TSH today.  Will continue on levothyroxine 88 mcg for now and adjust medication if needed.  Follow up in 1 year.      Relevant Orders   TSH    Other Visit Diagnoses    Annual physical exam    -  Primary   Relevant Orders   CBC with Differential/Platelet   Comprehensive metabolic panel   Encounter for hepatitis C screening test for low risk patient       Relevant Orders   Hepatitis C antibody   Encounter for lipid screening for cardiovascular disease       Relevant Orders   Lipid Panel w/o Chol/HDL Ratio       Follow up plan: Return in about 1 year (around 02/25/2021) for CPE with fasting labs.   LABORATORY TESTING:  - Pap smear: up to date  IMMUNIZATIONS:   - Tdap: Tetanus vaccination status reviewed: last tetanus booster within 10 years. - Influenza: Up to date - Pneumovax: Not applicable - Prevnar: Not  applicable - HPV: Not applicable - Zostavax vaccine: Not applicable - COVID-19 vaccine: recived Pfizer in July/September 2021  SCREENING: -Mammogram: Up to date  - Colonoscopy: Not applicable  - Bone Density: Not applicable  -Hearing Test: Not applicable  -Spirometry: Not applicable   PATIENT COUNSELING:   Advised to take 1 mg of folate supplement per day if capable of pregnancy.   Sexuality: Discussed sexually transmitted diseases, partner selection, use of condoms, avoidance of unintended pregnancy  and contraceptive alternatives.   Advised to avoid cigarette smoking.  I discussed with the patient that most people either abstain from alcohol or drink within safe limits (<=14/week and <=4 drinks/occasion for males, <=7/weeks and <= 3  drinks/occasion for females) and that the risk for alcohol disorders and other health effects rises proportionally with the number of drinks per week and how often a drinker exceeds daily limits.  Discussed cessation/primary prevention of drug use and availability of treatment for abuse.   Diet: Encouraged to adjust caloric intake to maintain  or achieve ideal body weight, to reduce intake of dietary saturated fat and total fat, to limit sodium intake by avoiding high sodium foods and not adding table salt, and to maintain adequate dietary potassium and calcium preferably from fresh fruits, vegetables, and low-fat dairy products.    stressed the importance of regular exercise  Injury prevention: Discussed safety belts, safety helmets, smoke detector, smoking near bedding or upholstery.   Dental health: Discussed importance of regular tooth brushing, flossing, and dental visits.    NEXT PREVENTATIVE PHYSICAL DUE IN 1 YEAR. Return in about 1 year (around 02/25/2021) for CPE with fasting labs.

## 2020-02-26 NOTE — Assessment & Plan Note (Signed)
Chronic, stable.  Will check TSH today.  Will continue on levothyroxine 88 mcg for now and adjust medication if needed.  Follow up in 1 year.

## 2020-02-27 ENCOUNTER — Other Ambulatory Visit: Payer: Self-pay | Admitting: Nurse Practitioner

## 2020-02-27 LAB — CBC WITH DIFFERENTIAL/PLATELET
Basophils Absolute: 0.1 10*3/uL (ref 0.0–0.2)
Basos: 1 %
EOS (ABSOLUTE): 0.2 10*3/uL (ref 0.0–0.4)
Eos: 3 %
Hematocrit: 40.9 % (ref 34.0–46.6)
Hemoglobin: 13.7 g/dL (ref 11.1–15.9)
Immature Grans (Abs): 0 10*3/uL (ref 0.0–0.1)
Immature Granulocytes: 0 %
Lymphocytes Absolute: 2.9 10*3/uL (ref 0.7–3.1)
Lymphs: 43 %
MCH: 29.1 pg (ref 26.6–33.0)
MCHC: 33.5 g/dL (ref 31.5–35.7)
MCV: 87 fL (ref 79–97)
Monocytes Absolute: 0.5 10*3/uL (ref 0.1–0.9)
Monocytes: 7 %
Neutrophils Absolute: 3.1 10*3/uL (ref 1.4–7.0)
Neutrophils: 46 %
Platelets: 290 10*3/uL (ref 150–450)
RBC: 4.71 x10E6/uL (ref 3.77–5.28)
RDW: 12.6 % (ref 11.7–15.4)
WBC: 6.7 10*3/uL (ref 3.4–10.8)

## 2020-02-27 LAB — LIPID PANEL W/O CHOL/HDL RATIO
Cholesterol, Total: 259 mg/dL — ABNORMAL HIGH (ref 100–199)
HDL: 108 mg/dL (ref >39)
LDL Chol Calc (NIH): 138 mg/dL — ABNORMAL HIGH (ref 0–99)
Triglycerides: 80 mg/dL (ref 0–149)
VLDL Cholesterol Cal: 13 mg/dL (ref 5–40)

## 2020-02-27 LAB — COMPREHENSIVE METABOLIC PANEL
ALT: 16 IU/L (ref 0–32)
AST: 20 IU/L (ref 0–40)
Albumin/Globulin Ratio: 1.9 (ref 1.2–2.2)
Albumin: 4.7 g/dL (ref 3.8–4.8)
Alkaline Phosphatase: 93 IU/L (ref 44–121)
BUN/Creatinine Ratio: 13 (ref 9–23)
BUN: 11 mg/dL (ref 6–24)
Bilirubin Total: 0.4 mg/dL (ref 0.0–1.2)
CO2: 21 mmol/L (ref 20–29)
Calcium: 9.6 mg/dL (ref 8.7–10.2)
Chloride: 100 mmol/L (ref 96–106)
Creatinine, Ser: 0.82 mg/dL (ref 0.57–1.00)
GFR calc Af Amer: 103 mL/min/{1.73_m2} (ref >59)
GFR calc non Af Amer: 89 mL/min/{1.73_m2} (ref >59)
Globulin, Total: 2.5 g/dL (ref 1.5–4.5)
Glucose: 85 mg/dL (ref 65–99)
Potassium: 4.1 mmol/L (ref 3.5–5.2)
Sodium: 139 mmol/L (ref 134–144)
Total Protein: 7.2 g/dL (ref 6.0–8.5)

## 2020-02-27 LAB — HEPATITIS C ANTIBODY: Hep C Virus Ab: <0.1 s/co ratio (ref 0.0–0.9)

## 2020-02-27 LAB — TSH: TSH: 1.71 u[IU]/mL (ref 0.450–4.500)

## 2020-02-27 MED ORDER — LEVOTHYROXINE SODIUM 88 MCG PO TABS: 88.0000 ug | ORAL_TABLET | Freq: Every day | ORAL | 3 refills | Status: DC

## 2020-03-19 ENCOUNTER — Other Ambulatory Visit: Payer: Self-pay | Admitting: Obstetrics & Gynecology

## 2020-03-19 DIAGNOSIS — Z1231 Encounter for screening mammogram for malignant neoplasm of breast: Secondary | ICD-10-CM

## 2020-04-17 ENCOUNTER — Telehealth: Payer: Self-pay

## 2020-04-17 NOTE — Telephone Encounter (Signed)
-----   Message from Nadara Mustard, MD sent at 03/25/2020  7:57 AM EST ----- Regarding: MMG Received notice she has not received MMG yet as ordered at her Annual. Please check and encourage her to do this, and document conversation.

## 2020-04-17 NOTE — Telephone Encounter (Signed)
Pt aware to schedule her mammogram 

## 2020-08-15 ENCOUNTER — Other Ambulatory Visit: Payer: Self-pay

## 2020-08-15 MED FILL — Levothyroxine Sodium Tab 88 MCG: ORAL | 90 days supply | Qty: 90 | Fill #0 | Status: AC

## 2020-12-09 ENCOUNTER — Other Ambulatory Visit: Payer: Self-pay

## 2020-12-09 MED FILL — Levothyroxine Sodium Tab 88 MCG: ORAL | 90 days supply | Qty: 90 | Fill #1 | Status: AC

## 2021-01-01 ENCOUNTER — Ambulatory Visit: Payer: No Typology Code available for payment source | Admitting: Obstetrics & Gynecology

## 2021-02-26 ENCOUNTER — Other Ambulatory Visit: Payer: Self-pay

## 2021-02-26 MED FILL — Levothyroxine Sodium Tab 88 MCG: ORAL | 90 days supply | Qty: 90 | Fill #2 | Status: AC

## 2021-03-10 ENCOUNTER — Other Ambulatory Visit: Payer: Self-pay | Admitting: Obstetrics and Gynecology

## 2021-03-10 DIAGNOSIS — N6489 Other specified disorders of breast: Secondary | ICD-10-CM

## 2021-04-24 ENCOUNTER — Other Ambulatory Visit: Payer: Self-pay

## 2021-04-24 ENCOUNTER — Ambulatory Visit
Admission: RE | Admit: 2021-04-24 | Discharge: 2021-04-24 | Disposition: A | Discharge status: Home / Self Care | Payer: No Typology Code available for payment source | Source: Ambulatory Visit | Attending: Obstetrics and Gynecology | Admitting: Obstetrics and Gynecology

## 2021-04-24 DIAGNOSIS — N6489 Other specified disorders of breast: Secondary | ICD-10-CM | POA: Insufficient documentation

## 2021-08-18 ENCOUNTER — Other Ambulatory Visit: Payer: Self-pay

## 2021-08-18 ENCOUNTER — Encounter: Payer: Self-pay | Admitting: Nurse Practitioner

## 2021-08-18 ENCOUNTER — Ambulatory Visit (INDEPENDENT_AMBULATORY_CARE_PROVIDER_SITE_OTHER): Payer: No Typology Code available for payment source | Admitting: Nurse Practitioner

## 2021-08-18 VITALS — BP 129/88 | HR 107 | Temp 98.3°F | Ht 64.37 in | Wt 148.4 lb

## 2021-08-18 DIAGNOSIS — Z0000 Encounter for general adult medical examination without abnormal findings: Secondary | ICD-10-CM | POA: Diagnosis not present

## 2021-08-18 DIAGNOSIS — E038 Other specified hypothyroidism: Secondary | ICD-10-CM | POA: Diagnosis not present

## 2021-08-18 DIAGNOSIS — E7800 Pure hypercholesterolemia, unspecified: Secondary | ICD-10-CM | POA: Diagnosis not present

## 2021-08-18 LAB — URINALYSIS, ROUTINE W REFLEX MICROSCOPIC
Bilirubin, UA: NEGATIVE
Glucose, UA: NEGATIVE
Leukocytes,UA: NEGATIVE
Nitrite, UA: NEGATIVE
Protein,UA: NEGATIVE
RBC, UA: NEGATIVE
Specific Gravity, UA: 1.02 (ref 1.005–1.030)
Urobilinogen, Ur: 0.2 mg/dL (ref 0.2–1.0)
pH, UA: 6 (ref 5.0–7.5)

## 2021-08-18 MED ORDER — LEVOTHYROXINE SODIUM 88 MCG PO TABS
88.0000 ug | ORAL_TABLET | Freq: Every day | ORAL | 3 refills | Status: DC
  Filled 2021-08-18: qty 30, 30d supply, fill #0
  Filled 2021-10-23: qty 30, 30d supply, fill #1
  Filled 2021-12-15: qty 30, 30d supply, fill #2
  Filled 2022-01-16: qty 30, 30d supply, fill #3
  Filled 2022-03-31: qty 30, 30d supply, fill #4
  Filled 2022-05-20: qty 30, 30d supply, fill #5
  Filled 2022-07-23: qty 30, 30d supply, fill #6

## 2021-08-18 NOTE — Assessment & Plan Note (Signed)
Chronic.  Controlled.  Continue with current medication regimen of Levothyroxine daily.  Labs ordered today.  Return to clinic in 1 year.  Call sooner if concerns arise.

## 2021-08-18 NOTE — Progress Notes (Signed)
BP 129/88   Pulse (!) 107   Temp 98.3 F (36.8 C) (Oral)   Ht 5' 4.37" (1.635 m)   Wt 148 lb 6.4 oz (67.3 kg)   SpO2 98%   Breastfeeding No   BMI 25.18 kg/m    Subjective:    Patient ID: Debbie CorinRebecca R Rivera, female    DOB: [birth year], 41 y.o.   MRN: [id number]  HPI: Debbie Rivera is a 41 y.o. female presenting on 08/18/2021 for comprehensive medical examination. Current medical complaints include:none  She currently lives with: Menopausal Symptoms: no  HYPOTHYROIDISM Thyroid control status:controlled Satisfied with current treatment? yes Medication side effects: no Medication compliance: excellent compliance Etiology of hypothyroidism:  Recent dose adjustment:no Fatigue: no Cold intolerance: no Heat intolerance: no Weight gain: no Weight loss: no Constipation: no Diarrhea/loose stools: no Palpitations: no Lower extremity edema: no Anxiety/depressed mood: no  Depression Screen done today and results listed below:     08/18/2021    2:54 PM 02/26/2020    9:52 AM 02/08/2019   11:16 AM 01/31/2018    2:12 PM 07/15/2017    9:12 AM  Depression screen PHQ 2/9  Decreased Interest 0 0 0 0 0  Down, Depressed, Hopeless 0 0 0 0 0  PHQ - 2 Score 0 0 0 0 0  Altered sleeping 0  0    Tired, decreased energy 0  0    Change in appetite 0  0    Feeling bad or failure about yourself  1  0    Trouble concentrating 0  0    Moving slowly or fidgety/restless 0  0    Suicidal thoughts 0  0    PHQ-9 Score 1  0    Difficult doing work/chores Somewhat difficult        The patient does not have a history of falls. I did complete a risk assessment for falls. A plan of care for falls was documented.   Past Medical History:  Past Medical History:  Diagnosis Date   Gestational diabetes 2007   Hypothyroidism     Surgical History:  Past Surgical History:  Procedure Laterality Date   BREAST REDUCTION SURGERY Bilateral    BREAST SURGERY     CESAREAN SECTION     REDUCTION MAMMAPLASTY  Bilateral 2009    Medications:  Current Outpatient Medications on File Prior to Visit  Medication Sig   levonorgestrel (MIRENA) 20 MCG/24HR IUD 1 each by Intrauterine route once.   No current facility-administered medications on file prior to visit.    Allergies:  No Known Allergies  Social History:  Social History   Socioeconomic History   Marital status: Married    Spouse name: Not on file   Number of children: Not on file   Years of education: Not on file   Highest education level: Not on file  Occupational History   Not on file  Tobacco Use   Smoking status: Former    Types: Cigarettes    Quit date: 10/13/2018    Years since quitting: 2.8   Smokeless tobacco: Never  Vaping Use   Vaping Use: Never used  Substance and Sexual Activity   Alcohol use: No   Drug use: No   Sexual activity: Yes    Birth control/protection: I.U.D.  Other Topics Concern   Not on file  Social History Narrative   Not on file   Social Determinants of Health   Financial Resource Strain: Not on file  Food Insecurity:  Not on file  Transportation Needs: Not on file  Physical Activity: Not on file  Stress: Not on file  Social Connections: Not on file  Intimate Partner Violence: Not on file   Social History   Tobacco Use  Smoking Status Former   Types: Cigarettes   Quit date: 10/13/2018   Years since quitting: 2.8  Smokeless Tobacco Never   Social History   Substance and Sexual Activity  Alcohol Use No    Family History:  Family History  Problem Relation Age of Onset   Diabetes Mother    Hypertension Maternal Grandfather    Breast cancer Neg Hx     Past medical history, surgical history, medications, allergies, family history and social history reviewed with patient today and changes made to appropriate areas of the chart.   Review of Systems  Constitutional:  Negative for malaise/fatigue and weight loss.  Cardiovascular:  Negative for palpitations and leg swelling.   Gastrointestinal:  Negative for constipation and diarrhea.  Psychiatric/Behavioral:  Negative for depression. The patient is not nervous/anxious.   All other ROS negative except what is listed above and in the HPI.      Objective:    BP 129/88   Pulse (!) 107   Temp 98.3 F (36.8 C) (Oral)   Ht 5' 4.37" (1.635 m)   Wt 148 lb 6.4 oz (67.3 kg)   SpO2 98%   Breastfeeding No   BMI 25.18 kg/m   Wt Readings from Last 3 Encounters:  08/18/21 148 lb 6.4 oz (67.3 kg)  02/26/20 150 lb 8 oz (68.3 kg)  12/21/19 147 lb (66.7 kg)    Physical Exam Vitals and nursing note reviewed.  Constitutional:      General: She is awake. She is not in acute distress.    Appearance: Normal appearance. She is well-developed and normal weight. She is not ill-appearing.  HENT:     Head: Normocephalic and atraumatic.     Right Ear: Hearing, tympanic membrane, ear canal and external ear normal. No drainage.     Left Ear: Hearing, tympanic membrane, ear canal and external ear normal. No drainage.     Nose: Nose normal.     Right Sinus: No maxillary sinus tenderness or frontal sinus tenderness.     Left Sinus: No maxillary sinus tenderness or frontal sinus tenderness.     Mouth/Throat:     Mouth: Mucous membranes are moist.     Pharynx: Oropharynx is clear. Uvula midline. No pharyngeal swelling, oropharyngeal exudate or posterior oropharyngeal erythema.  Eyes:     General: Lids are normal.        Right eye: No discharge.        Left eye: No discharge.     Extraocular Movements: Extraocular movements intact.     Conjunctiva/sclera: Conjunctivae normal.     Pupils: Pupils are equal, round, and reactive to light.     Visual Fields: Right eye visual fields normal and left eye visual fields normal.  Neck:     Thyroid: No thyromegaly.     Vascular: No carotid bruit.     Trachea: Trachea normal.  Cardiovascular:     Rate and Rhythm: Normal rate and regular rhythm.     Heart sounds: Normal heart sounds. No  murmur heard.   No gallop.  Pulmonary:     Effort: Pulmonary effort is normal. No accessory muscle usage or respiratory distress.     Breath sounds: Normal breath sounds.  Chest:  Breasts:  Right: Normal.     Left: Normal.  Abdominal:     General: Bowel sounds are normal.     Palpations: Abdomen is soft. There is no hepatomegaly or splenomegaly.     Tenderness: There is no abdominal tenderness.  Musculoskeletal:        General: Normal range of motion.     Cervical back: Normal range of motion and neck supple.     Right lower leg: No edema.     Left lower leg: No edema.  Lymphadenopathy:     Head:     Right side of head: No submental, submandibular, tonsillar, preauricular or posterior auricular adenopathy.     Left side of head: No submental, submandibular, tonsillar, preauricular or posterior auricular adenopathy.     Cervical: No cervical adenopathy.     Upper Body:     Right upper body: No supraclavicular, axillary or pectoral adenopathy.     Left upper body: No supraclavicular, axillary or pectoral adenopathy.  Skin:    General: Skin is warm and dry.     Capillary Refill: Capillary refill takes less than 2 seconds.     Findings: No rash.  Neurological:     Mental Status: She is alert and oriented to person, place, and time.     Gait: Gait is intact.     Deep Tendon Reflexes: Reflexes are normal and symmetric.     Reflex Scores:      Brachioradialis reflexes are 2+ on the right side and 2+ on the left side.      Patellar reflexes are 2+ on the right side and 2+ on the left side. Psychiatric:        Attention and Perception: Attention normal.        Mood and Affect: Mood normal.        Speech: Speech normal.        Behavior: Behavior normal. Behavior is cooperative.        Thought Content: Thought content normal.        Judgment: Judgment normal.    Results for orders placed or performed in visit on 02/26/20  Hepatitis C antibody  Result Value Ref Range   Hep C  Virus Ab <0.1 0.0 - 0.9 s/co ratio  Lipid Panel w/o Chol/HDL Ratio  Result Value Ref Range   Cholesterol, Total 259 (H) 100 - 199 mg/dL   Triglycerides 80 0 - 149 mg/dL   HDL 643 >32 mg/dL   VLDL Cholesterol Cal 13 5 - 40 mg/dL   LDL Chol Calc (NIH) 951 (H) 0 - 99 mg/dL  TSH  Result Value Ref Range   TSH 1.710 0.450 - 4.500 uIU/mL  CBC with Differential/Platelet  Result Value Ref Range   WBC 6.7 3.4 - 10.8 x10E3/uL   RBC 4.71 3.77 - 5.28 x10E6/uL   Hemoglobin 13.7 11.1 - 15.9 g/dL   Hematocrit 88.4 16.6 - 46.6 %   MCV 87 79 - 97 fL   MCH 29.1 26.6 - 33.0 pg   MCHC 33.5 31.5 - 35.7 g/dL   RDW 06.3 01.6 - 01.0 %   Platelets 290 150 - 450 x10E3/uL   Neutrophils 46 Not Estab. %   Lymphs 43 Not Estab. %   Monocytes 7 Not Estab. %   Eos 3 Not Estab. %   Basos 1 Not Estab. %   Neutrophils Absolute 3.1 1.4 - 7.0 x10E3/uL   Lymphocytes Absolute 2.9 0.7 - 3.1 x10E3/uL   Monocytes Absolute 0.5 0.1 -  0.9 x10E3/uL   EOS (ABSOLUTE) 0.2 0.0 - 0.4 x10E3/uL   Basophils Absolute 0.1 0.0 - 0.2 x10E3/uL   Immature Granulocytes 0 Not Estab. %   Immature Grans (Abs) 0.0 0.0 - 0.1 x10E3/uL  Comprehensive metabolic panel  Result Value Ref Range   Glucose 85 65 - 99 mg/dL   BUN 11 6 - 24 mg/dL   Creatinine, Ser 7.61 0.57 - 1.00 mg/dL   GFR calc non Af Amer 89 >59 mL/min/1.73   GFR calc Af Amer 103 >59 mL/min/1.73   BUN/Creatinine Ratio 13 9 - 23   Sodium 139 134 - 144 mmol/L   Potassium 4.1 3.5 - 5.2 mmol/L   Chloride 100 96 - 106 mmol/L   CO2 21 20 - 29 mmol/L   Calcium 9.6 8.7 - 10.2 mg/dL   Total Protein 7.2 6.0 - 8.5 g/dL   Albumin 4.7 3.8 - 4.8 g/dL   Globulin, Total 2.5 1.5 - 4.5 g/dL   Albumin/Globulin Ratio 1.9 1.2 - 2.2   Bilirubin Total 0.4 0.0 - 1.2 mg/dL   Alkaline Phosphatase 93 44 - 121 IU/L   AST 20 0 - 40 IU/L   ALT 16 0 - 32 IU/L      Assessment & Plan:   Problem List Items Addressed This Visit       Endocrine   Hypothyroidism    Chronic.  Controlled.  Continue  with current medication regimen of Levothyroxine daily.  Labs ordered today.  Return to clinic in 1 year.  Call sooner if concerns arise.         Relevant Medications   levothyroxine (SYNTHROID) 88 MCG tablet   Other Relevant Orders   TSH   T4, free   Other Visit Diagnoses     Annual physical exam    -  Primary   Health maintenance reviewed during visit today. Up to date on PAP and Mammogram.   Relevant Orders   CBC with Differential/Platelet   Comprehensive metabolic panel   Urinalysis, Routine w reflex microscopic   Hypercholesteremia       Labs ordered today. Will make recommendations based on lab results.   Relevant Orders   Lipid panel        Follow up plan: Return in about 1 year (around 08/19/2022) for Physical and Fasting labs.   LABORATORY TESTING:  - Pap smear: up to date  IMMUNIZATIONS:   - Tdap: Tetanus vaccination status reviewed: last tetanus booster within 10 years. - Influenza: Postponed to flu season - Pneumovax: Not applicable - Prevnar: Not applicable - COVID: Up to date - HPV: Not applicable - Shingrix vaccine: Not applicable  SCREENING: -Mammogram: Up to date  - Colonoscopy: Not applicable  - Bone Density: Not applicable  -Hearing Test: Not applicable  -Spirometry: Not applicable   PATIENT COUNSELING:   Advised to take 1 mg of folate supplement per day if capable of pregnancy.   Sexuality: Discussed sexually transmitted diseases, partner selection, use of condoms, avoidance of unintended pregnancy  and contraceptive alternatives.   Advised to avoid cigarette smoking.  I discussed with the patient that most people either abstain from alcohol or drink within safe limits (<=14/week and <=4 drinks/occasion for males, <=7/weeks and <= 3 drinks/occasion for females) and that the risk for alcohol disorders and other health effects rises proportionally with the number of drinks per week and how often a drinker exceeds daily  limits.  Discussed cessation/primary prevention of drug use and availability of treatment for abuse.  Diet: Encouraged to adjust caloric intake to maintain  or achieve ideal body weight, to reduce intake of dietary saturated fat and total fat, to limit sodium intake by avoiding high sodium foods and not adding table salt, and to maintain adequate dietary potassium and calcium preferably from fresh fruits, vegetables, and low-fat dairy products.    stressed the importance of regular exercise  Injury prevention: Discussed safety belts, safety helmets, smoke detector, smoking near bedding or upholstery.   Dental health: Discussed importance of regular tooth brushing, flossing, and dental visits.    NEXT PREVENTATIVE PHYSICAL DUE IN 1 YEAR. Return in about 1 year (around 08/19/2022) for Physical and Fasting labs.

## 2021-08-19 ENCOUNTER — Other Ambulatory Visit: Payer: Self-pay

## 2021-08-19 LAB — COMPREHENSIVE METABOLIC PANEL
ALT: 12 IU/L (ref 0–32)
AST: 17 IU/L (ref 0–40)
Albumin/Globulin Ratio: 1.6 (ref 1.2–2.2)
Albumin: 4.9 g/dL — ABNORMAL HIGH (ref 3.8–4.8)
Alkaline Phosphatase: 89 IU/L (ref 44–121)
BUN/Creatinine Ratio: 13 (ref 9–23)
BUN: 10 mg/dL (ref 6–24)
Bilirubin Total: 0.5 mg/dL (ref 0.0–1.2)
CO2: 23 mmol/L (ref 20–29)
Calcium: 10 mg/dL (ref 8.7–10.2)
Chloride: 100 mmol/L (ref 96–106)
Creatinine, Ser: 0.78 mg/dL (ref 0.57–1.00)
Globulin, Total: 3 g/dL (ref 1.5–4.5)
Glucose: 87 mg/dL (ref 70–99)
Potassium: 4.1 mmol/L (ref 3.5–5.2)
Sodium: 140 mmol/L (ref 134–144)
Total Protein: 7.9 g/dL (ref 6.0–8.5)
eGFR: 97 mL/min/{1.73_m2} (ref >59)

## 2021-08-19 LAB — CBC WITH DIFFERENTIAL/PLATELET
Basophils Absolute: 0.1 10*3/uL (ref 0.0–0.2)
Basos: 1 %
EOS (ABSOLUTE): 0.1 10*3/uL (ref 0.0–0.4)
Eos: 1 %
Hematocrit: 40.9 % (ref 34.0–46.6)
Hemoglobin: 14 g/dL (ref 11.1–15.9)
Immature Grans (Abs): 0 10*3/uL (ref 0.0–0.1)
Immature Granulocytes: 0 %
Lymphocytes Absolute: 2.5 10*3/uL (ref 0.7–3.1)
Lymphs: 45 %
MCH: 29.3 pg (ref 26.6–33.0)
MCHC: 34.2 g/dL (ref 31.5–35.7)
MCV: 86 fL (ref 79–97)
Monocytes Absolute: 0.4 10*3/uL (ref 0.1–0.9)
Monocytes: 7 %
Neutrophils Absolute: 2.6 10*3/uL (ref 1.4–7.0)
Neutrophils: 46 %
Platelets: 300 10*3/uL (ref 150–450)
RBC: 4.78 x10E6/uL (ref 3.77–5.28)
RDW: 13.1 % (ref 11.7–15.4)
WBC: 5.6 10*3/uL (ref 3.4–10.8)

## 2021-08-19 LAB — TSH: TSH: 5.58 u[IU]/mL — ABNORMAL HIGH (ref 0.450–4.500)

## 2021-08-19 LAB — LIPID PANEL
Chol/HDL Ratio: 2.8 ratio (ref 0.0–4.4)
Cholesterol, Total: 245 mg/dL — ABNORMAL HIGH (ref 100–199)
HDL: 89 mg/dL (ref >39)
LDL Chol Calc (NIH): 145 mg/dL — ABNORMAL HIGH (ref 0–99)
Triglycerides: 67 mg/dL (ref 0–149)
VLDL Cholesterol Cal: 11 mg/dL (ref 5–40)

## 2021-08-19 LAB — T4, FREE: Free T4: 1.41 ng/dL (ref 0.82–1.77)

## 2021-08-19 NOTE — Progress Notes (Signed)
Hi Debbie Rivera. It was nice to meet you yesterday.  Your lab work shows that your cholesterol is elevated.  I recommend following a low fat diet and exercising 5x weekly for at least 30 minutes. I calculated your cardiac risk score that you can see below and you are in the low risk range.  No need to start medication at this time.   Your TSH is slightly elevated but your T4 is normal.  I'd like you to come back in 6 months and recheck this to make sure we aren't increasing again.  Please make an appointment.  Hope you have a great day.   The 10-year ASCVD risk score (Arnett DK, et al., 2019) is: 0.4%   Values used to calculate the score:     Age: 41 years     Sex: Female     Is Non-Hispanic African American: No     Diabetic: No     Tobacco smoker: No     Systolic Blood Pressure: 129 mmHg     Is BP treated: No     HDL Cholesterol: 89 mg/dL     Total Cholesterol: 245 mg/dL

## 2021-08-20 ENCOUNTER — Other Ambulatory Visit: Payer: Self-pay

## 2021-09-26 IMAGING — MG MM DIGITAL DIAGNOSTIC UNILAT*L* W/ TOMO W/ CAD
6 series · 6 of 18 positions shown · non-contrast
Comparison: Previous exam(s).

CLINICAL DATA: Six-month follow-up of a left breast asymmetry.

EXAM:
DIGITAL DIAGNOSTIC UNILATERAL LEFT MAMMOGRAM WITH CAD AND TOMO

[L CC synth-2D (1 of 2)]
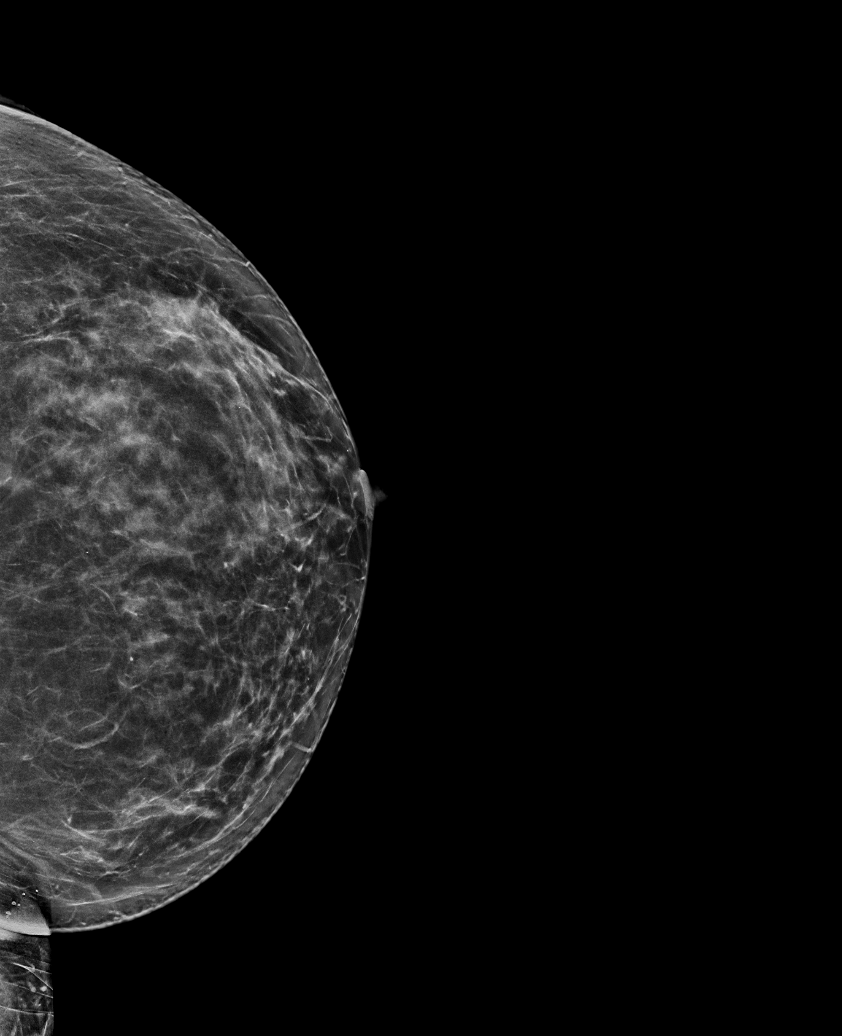

[L CC synth-2D (2 of 2)]
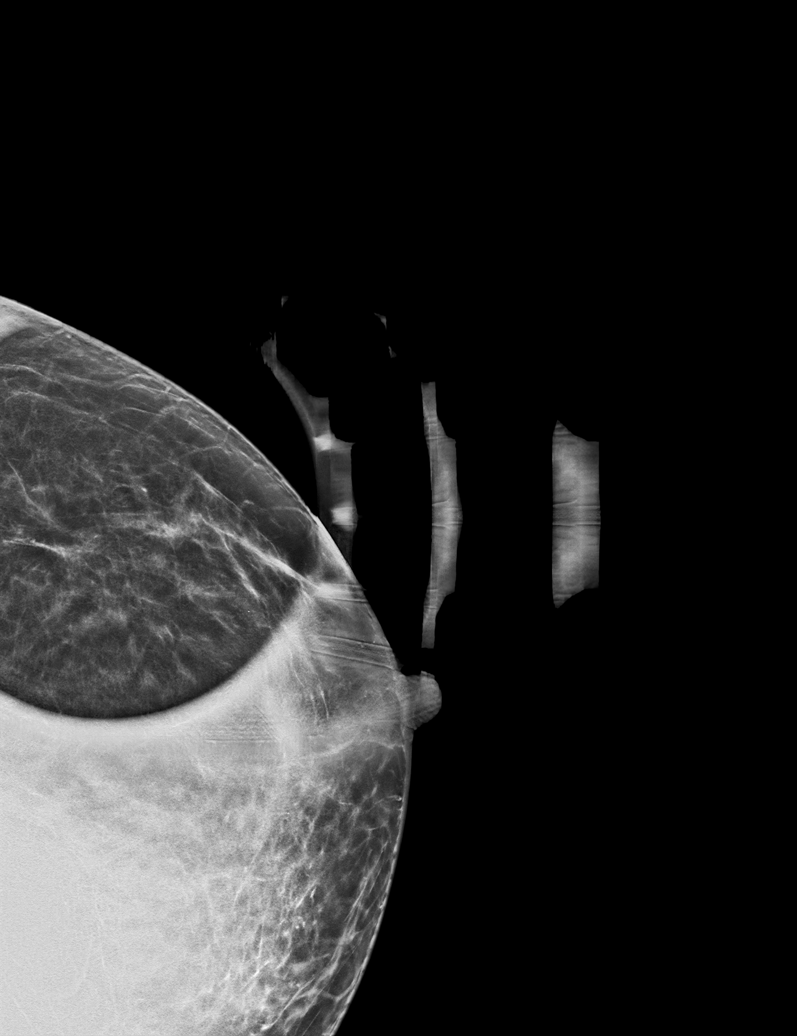

[L MLO synth-2D]
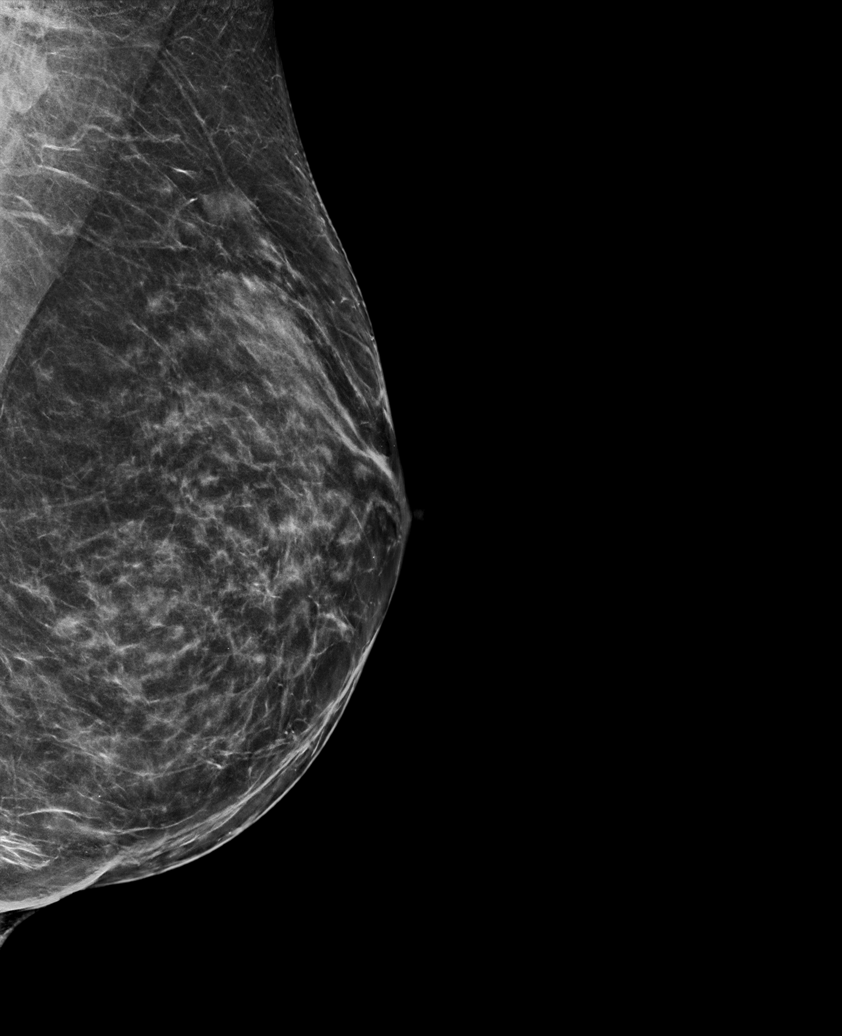

[L CC tomo (1 of 2) · tomo slice 31/60.0]
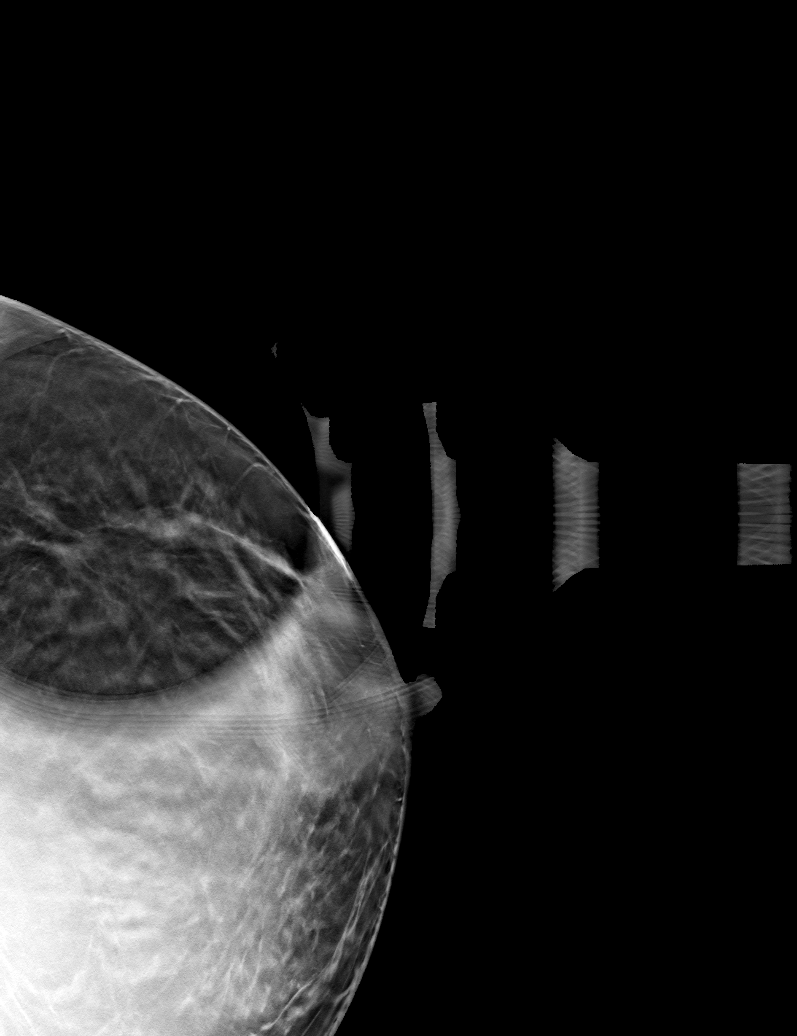

[L CC tomo (2 of 2) · tomo slice 39/77.0]
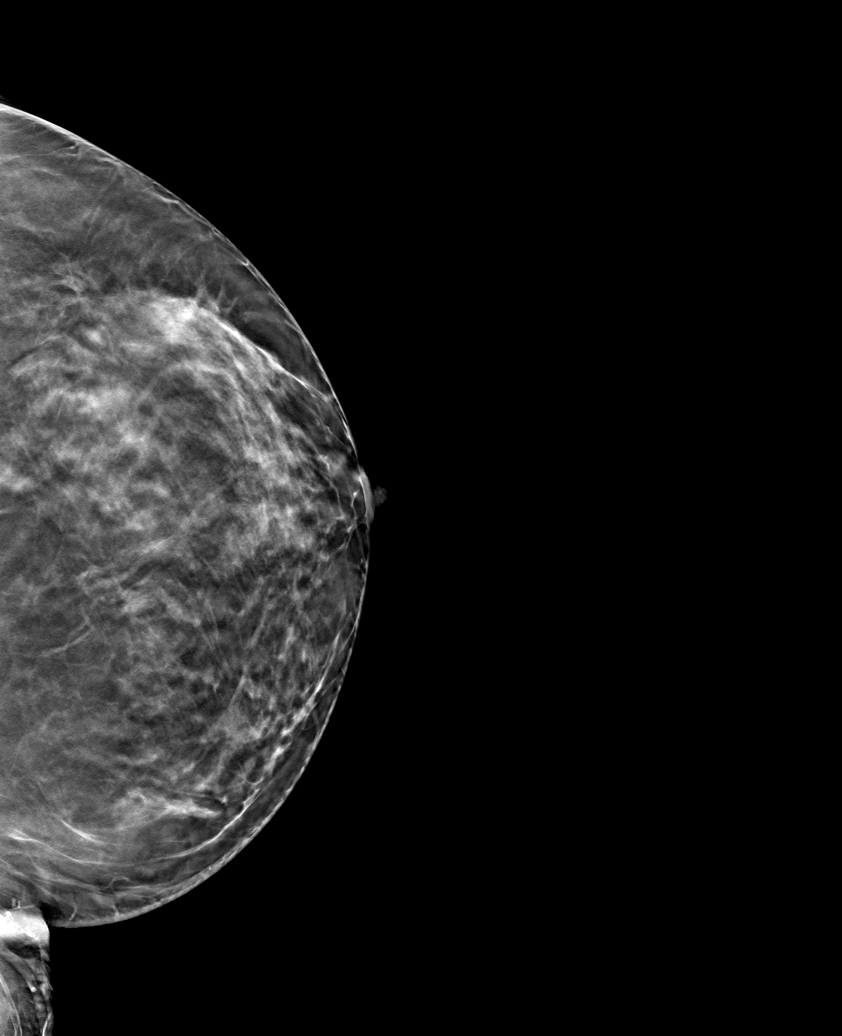

[L MLO tomo · tomo slice 38/75.0]
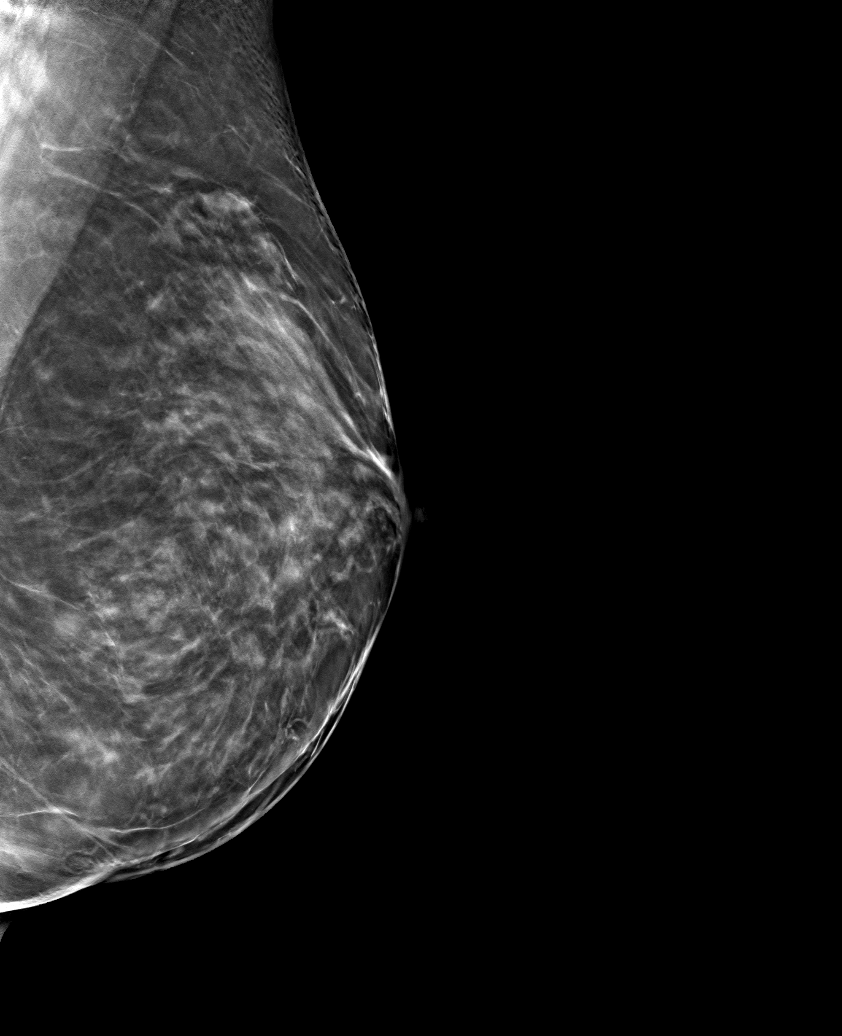

[6 of 18 positions shown; findings below may reference images not displayed]

ACR Breast Density Category c: The breast tissue is heterogeneously
dense, which may obscure small masses.
FINDINGS: Post reduction changes are seen in the left breast as expected. The
asymmetry in the left breast on the MLO view only at a mid to
posterior depth below the level the nipple is less conspicuous in
the interval but does persist. No other suspicious findings.

Mammographic images were processed with CAD.
IMPRESSION: No significant change in the probably benign left breast asymmetry.

RECOMMENDATION:
Recommend follow-up mammography of the probably benign left breast
asymmetry. The patient will be due for bilateral mammography in
Friday November, 2019.

I have discussed the findings and recommendations with the patient.
If applicable, a reminder letter will be sent to the patient
regarding the next appointment.

BI-RADS CATEGORY  3: Probably benign.

## 2021-10-23 ENCOUNTER — Other Ambulatory Visit: Payer: Self-pay

## 2021-12-15 ENCOUNTER — Other Ambulatory Visit: Payer: Self-pay

## 2022-01-16 ENCOUNTER — Other Ambulatory Visit: Payer: Self-pay

## 2022-04-02 DIAGNOSIS — N6489 Other specified disorders of breast: Secondary | ICD-10-CM | POA: Diagnosis not present

## 2022-04-02 DIAGNOSIS — Z01411 Encounter for gynecological examination (general) (routine) with abnormal findings: Secondary | ICD-10-CM | POA: Diagnosis not present

## 2022-04-02 DIAGNOSIS — Z1331 Encounter for screening for depression: Secondary | ICD-10-CM | POA: Diagnosis not present

## 2022-04-02 DIAGNOSIS — Z30432 Encounter for removal of intrauterine contraceptive device: Secondary | ICD-10-CM | POA: Diagnosis not present

## 2022-04-02 DIAGNOSIS — Z124 Encounter for screening for malignant neoplasm of cervix: Secondary | ICD-10-CM | POA: Diagnosis not present

## 2022-04-02 LAB — HM PAP SMEAR: HM Pap smear: NORMAL

## 2022-04-03 ENCOUNTER — Other Ambulatory Visit: Payer: Self-pay | Admitting: Certified Nurse Midwife

## 2022-04-03 DIAGNOSIS — Z1231 Encounter for screening mammogram for malignant neoplasm of breast: Secondary | ICD-10-CM

## 2022-04-27 ENCOUNTER — Ambulatory Visit
Admission: RE | Admit: 2022-04-27 | Discharge: 2022-04-27 | Disposition: A | Discharge status: Home / Self Care | Payer: 59 | Source: Ambulatory Visit | Attending: Certified Nurse Midwife | Admitting: Certified Nurse Midwife

## 2022-04-27 DIAGNOSIS — Z1231 Encounter for screening mammogram for malignant neoplasm of breast: Secondary | ICD-10-CM | POA: Diagnosis not present

## 2022-07-23 ENCOUNTER — Other Ambulatory Visit: Payer: Self-pay

## 2022-09-07 ENCOUNTER — Encounter: Payer: Self-pay | Admitting: Nurse Practitioner

## 2022-09-07 ENCOUNTER — Other Ambulatory Visit: Payer: Self-pay

## 2022-09-07 ENCOUNTER — Ambulatory Visit (INDEPENDENT_AMBULATORY_CARE_PROVIDER_SITE_OTHER): Payer: 59 | Admitting: Nurse Practitioner

## 2022-09-07 VITALS — BP 123/85 | HR 85 | Temp 98.0°F | Ht 64.57 in | Wt 144.4 lb

## 2022-09-07 DIAGNOSIS — E038 Other specified hypothyroidism: Secondary | ICD-10-CM | POA: Diagnosis not present

## 2022-09-07 DIAGNOSIS — Z0000 Encounter for general adult medical examination without abnormal findings: Secondary | ICD-10-CM

## 2022-09-07 DIAGNOSIS — E7800 Pure hypercholesterolemia, unspecified: Secondary | ICD-10-CM | POA: Diagnosis not present

## 2022-09-07 LAB — MICROSCOPIC EXAMINATION: Bacteria, UA: NONE SEEN

## 2022-09-07 LAB — URINALYSIS, ROUTINE W REFLEX MICROSCOPIC
Bilirubin, UA: NEGATIVE
Glucose, UA: NEGATIVE
Nitrite, UA: NEGATIVE
Protein,UA: NEGATIVE
RBC, UA: NEGATIVE
Specific Gravity, UA: 1.015 (ref 1.005–1.030)
Urobilinogen, Ur: 0.2 mg/dL (ref 0.2–1.0)
pH, UA: 5 (ref 5.0–7.5)

## 2022-09-07 MED ORDER — LEVOTHYROXINE SODIUM 88 MCG PO TABS
88.0000 ug | ORAL_TABLET | Freq: Every day | ORAL | 3 refills | Status: DC
  Filled 2022-09-07 – 2022-10-14 (×2): qty 90, 90d supply, fill #0
  Filled 2023-01-15: qty 90, 90d supply, fill #1
  Filled 2023-04-21: qty 90, 90d supply, fill #2
  Filled 2023-07-17: qty 90, 90d supply, fill #3

## 2022-09-07 NOTE — Assessment & Plan Note (Signed)
Chronic.  Controlled.  Continue with current medication regimen of Levothyroxine daily.  Refills sent today.  Labs ordered today.  Return to clinic in 6 months for reevaluation.  Call sooner if concerns arise.

## 2022-09-07 NOTE — Assessment & Plan Note (Signed)
Labs ordered at visit today.  Will make recommendations based on lab results.   

## 2022-09-07 NOTE — Progress Notes (Signed)
BP 123/85   Pulse 85   Temp 98 F (36.7 C) (Oral)   Ht 5' 4.57" (1.64 m)   Wt 144 lb 6.4 oz (65.5 kg)   LMP  (LMP Unknown)   SpO2 100%   BMI 24.35 kg/m    Subjective:    Patient ID: Debbie Rivera, female    DOB: 12-01-1980, 42 y.o.   MRN: [id number]  HPI: Debbie Rivera is a 42 y.o. female presenting on 09/07/2022 for comprehensive medical examination. Current medical complaints include:none  She currently lives with: Menopausal Symptoms: no  HYPOTHYROIDISM Thyroid control status:controlled Satisfied with current treatment? yes Medication side effects: no Medication compliance: excellent compliance Etiology of hypothyroidism:  Recent dose adjustment:no Fatigue: no Cold intolerance: yes Heat intolerance: no Weight gain: no Weight loss: no Constipation: no Diarrhea/loose stools: no Palpitations: no Lower extremity edema: no Anxiety/depressed mood: no  Depression Screen done today and results listed below:     09/07/2022    3:08 PM 08/18/2021    2:54 PM 02/26/2020    9:52 AM 02/08/2019   11:16 AM 01/31/2018    2:12 PM  Depression screen PHQ 2/9  Decreased Interest 0 0 0 0 0  Down, Depressed, Hopeless 0 0 0 0 0  PHQ - 2 Score 0 0 0 0 0  Altered sleeping 0 0  0   Tired, decreased energy 0 0  0   Change in appetite 0 0  0   Feeling bad or failure about yourself  0 1  0   Trouble concentrating 0 0  0   Moving slowly or fidgety/restless 0 0  0   Suicidal thoughts 0 0  0   PHQ-9 Score 0 1  0   Difficult doing work/chores  Somewhat difficult       The patient does not have a history of falls. I did complete a risk assessment for falls. A plan of care for falls was documented.   Past Medical History:  Past Medical History:  Diagnosis Date   Gestational diabetes 2007   Hypothyroidism     Surgical History:  Past Surgical History:  Procedure Laterality Date   BREAST REDUCTION SURGERY Bilateral    BREAST SURGERY     CESAREAN SECTION     REDUCTION  MAMMAPLASTY Bilateral 2009    Medications:  No current outpatient medications on file prior to visit.   No current facility-administered medications on file prior to visit.    Allergies:  No Known Allergies  Social History:  Social History   Socioeconomic History   Marital status: Married    Spouse name: Not on file   Number of children: Not on file   Years of education: Not on file   Highest education level: Not on file  Occupational History   Not on file  Tobacco Use   Smoking status: Former    Types: Cigarettes    Quit date: 10/13/2018    Years since quitting: 3.9   Smokeless tobacco: Never  Vaping Use   Vaping Use: Never used  Substance and Sexual Activity   Alcohol use: No   Drug use: No   Sexual activity: Yes    Birth control/protection: I.U.D.  Other Topics Concern   Not on file  Social History Narrative   Not on file   Social Determinants of Health   Financial Resource Strain: Not on file  Food Insecurity: Not on file  Transportation Needs: Not on file  Physical Activity: Not on  file  Stress: Not on file  Social Connections: Not on file  Intimate Partner Violence: Not on file   Social History   Tobacco Use  Smoking Status Former   Types: Cigarettes   Quit date: 10/13/2018   Years since quitting: 3.9  Smokeless Tobacco Never   Social History   Substance and Sexual Activity  Alcohol Use No    Family History:  Family History  Problem Relation Age of Onset   Diabetes Mother    Hypertension Maternal Grandfather    Breast cancer Neg Hx     Past medical history, surgical history, medications, allergies, family history and social history reviewed with patient today and changes made to appropriate areas of the chart.   Review of Systems  Constitutional:  Negative for malaise/fatigue and weight loss.  Cardiovascular:  Negative for palpitations and leg swelling.  Gastrointestinal:  Negative for constipation and diarrhea.   Psychiatric/Behavioral:  Negative for depression. The patient is not nervous/anxious.    All other ROS negative except what is listed above and in the HPI.      Objective:    BP 123/85   Pulse 85   Temp 98 F (36.7 C) (Oral)   Ht 5' 4.57" (1.64 m)   Wt 144 lb 6.4 oz (65.5 kg)   LMP  (LMP Unknown)   SpO2 100%   BMI 24.35 kg/m   Wt Readings from Last 3 Encounters:  09/07/22 144 lb 6.4 oz (65.5 kg)  08/18/21 148 lb 6.4 oz (67.3 kg)  02/26/20 150 lb 8 oz (68.3 kg)    Physical Exam Vitals and nursing note reviewed.  Constitutional:      General: She is awake. She is not in acute distress.    Appearance: Normal appearance. She is well-developed and normal weight. She is not ill-appearing.  HENT:     Head: Normocephalic and atraumatic.     Right Ear: Hearing, tympanic membrane, ear canal and external ear normal. No drainage.     Left Ear: Hearing, tympanic membrane, ear canal and external ear normal. No drainage.     Nose: Nose normal.     Right Sinus: No maxillary sinus tenderness or frontal sinus tenderness.     Left Sinus: No maxillary sinus tenderness or frontal sinus tenderness.     Mouth/Throat:     Mouth: Mucous membranes are moist.     Pharynx: Oropharynx is clear. Uvula midline. No pharyngeal swelling, oropharyngeal exudate or posterior oropharyngeal erythema.  Eyes:     General: Lids are normal.        Right eye: No discharge.        Left eye: No discharge.     Extraocular Movements: Extraocular movements intact.     Conjunctiva/sclera: Conjunctivae normal.     Pupils: Pupils are equal, round, and reactive to light.     Visual Fields: Right eye visual fields normal and left eye visual fields normal.  Neck:     Thyroid: No thyromegaly.     Vascular: No carotid bruit.     Trachea: Trachea normal.  Cardiovascular:     Rate and Rhythm: Normal rate and regular rhythm.     Heart sounds: Normal heart sounds. No murmur heard.    No gallop.  Pulmonary:     Effort:  Pulmonary effort is normal. No accessory muscle usage or respiratory distress.     Breath sounds: Normal breath sounds.  Chest:  Breasts:    Right: Normal.     Left: Normal.  Abdominal:     General: Bowel sounds are normal.     Palpations: Abdomen is soft. There is no hepatomegaly or splenomegaly.     Tenderness: There is no abdominal tenderness.  Musculoskeletal:        General: Normal range of motion.     Cervical back: Normal range of motion and neck supple.     Right lower leg: No edema.     Left lower leg: No edema.  Lymphadenopathy:     Head:     Right side of head: No submental, submandibular, tonsillar, preauricular or posterior auricular adenopathy.     Left side of head: No submental, submandibular, tonsillar, preauricular or posterior auricular adenopathy.     Cervical: No cervical adenopathy.     Upper Body:     Right upper body: No supraclavicular, axillary or pectoral adenopathy.     Left upper body: No supraclavicular, axillary or pectoral adenopathy.  Skin:    General: Skin is warm and dry.     Capillary Refill: Capillary refill takes less than 2 seconds.     Findings: No rash.  Neurological:     Mental Status: She is alert and oriented to person, place, and time.     Gait: Gait is intact.     Deep Tendon Reflexes: Reflexes are normal and symmetric.     Reflex Scores:      Brachioradialis reflexes are 2+ on the right side and 2+ on the left side.      Patellar reflexes are 2+ on the right side and 2+ on the left side. Psychiatric:        Attention and Perception: Attention normal.        Mood and Affect: Mood normal.        Speech: Speech normal.        Behavior: Behavior normal. Behavior is cooperative.        Thought Content: Thought content normal.        Judgment: Judgment normal.     Results for orders placed or performed in visit on 08/18/21  CBC with Differential/Platelet  Result Value Ref Range   WBC 5.6 3.4 - 10.8 x10E3/uL   RBC 4.78 3.77 -  5.28 x10E6/uL   Hemoglobin 14.0 11.1 - 15.9 g/dL   Hematocrit 42.7 06.2 - 46.6 %   MCV 86 79 - 97 fL   MCH 29.3 26.6 - 33.0 pg   MCHC 34.2 31.5 - 35.7 g/dL   RDW 37.6 28.3 - 15.1 %   Platelets 300 150 - 450 x10E3/uL   Neutrophils 46 Not Estab. %   Lymphs 45 Not Estab. %   Monocytes 7 Not Estab. %   Eos 1 Not Estab. %   Basos 1 Not Estab. %   Neutrophils Absolute 2.6 1.4 - 7.0 x10E3/uL   Lymphocytes Absolute 2.5 0.7 - 3.1 x10E3/uL   Monocytes Absolute 0.4 0.1 - 0.9 x10E3/uL   EOS (ABSOLUTE) 0.1 0.0 - 0.4 x10E3/uL   Basophils Absolute 0.1 0.0 - 0.2 x10E3/uL   Immature Granulocytes 0 Not Estab. %   Immature Grans (Abs) 0.0 0.0 - 0.1 x10E3/uL  Comprehensive metabolic panel  Result Value Ref Range   Glucose 87 70 - 99 mg/dL   BUN 10 6 - 24 mg/dL   Creatinine, Ser 7.61 0.57 - 1.00 mg/dL   eGFR 97 >60 VP/XTG/6.26   BUN/Creatinine Ratio 13 9 - 23   Sodium 140 134 - 144 mmol/L   Potassium 4.1 3.5 - 5.2  mmol/L   Chloride 100 96 - 106 mmol/L   CO2 23 20 - 29 mmol/L   Calcium 10.0 8.7 - 10.2 mg/dL   Total Protein 7.9 6.0 - 8.5 g/dL   Albumin 4.9 (H) 3.8 - 4.8 g/dL   Globulin, Total 3.0 1.5 - 4.5 g/dL   Albumin/Globulin Ratio 1.6 1.2 - 2.2   Bilirubin Total 0.5 0.0 - 1.2 mg/dL   Alkaline Phosphatase 89 44 - 121 IU/L   AST 17 0 - 40 IU/L   ALT 12 0 - 32 IU/L  Lipid panel  Result Value Ref Range   Cholesterol, Total 245 (H) 100 - 199 mg/dL   Triglycerides 67 0 - 149 mg/dL   HDL 89 >16 mg/dL   VLDL Cholesterol Cal 11 5 - 40 mg/dL   LDL Chol Calc (NIH) 109 (H) 0 - 99 mg/dL   Chol/HDL Ratio 2.8 0.0 - 4.4 ratio  TSH  Result Value Ref Range   TSH 5.580 (H) 0.450 - 4.500 uIU/mL  Urinalysis, Routine w reflex microscopic  Result Value Ref Range   Specific Gravity, UA 1.020 1.005 - 1.030   pH, UA 6.0 5.0 - 7.5   Color, UA Yellow Yellow   Appearance Ur Clear Clear   Leukocytes,UA Negative Negative   Protein,UA Negative Negative/Trace   Glucose, UA Negative Negative   Ketones, UA 2+  (A) Negative   RBC, UA Negative Negative   Bilirubin, UA Negative Negative   Urobilinogen, Ur 0.2 0.2 - 1.0 mg/dL   Nitrite, UA Negative Negative  T4, free  Result Value Ref Range   Free T4 1.41 0.82 - 1.77 ng/dL      Assessment & Plan:   Problem List Items Addressed This Visit       Endocrine   Hypothyroidism    Chronic.  Controlled.  Continue with current medication regimen of Levothyroxine daily.  Refills sent today.  Labs ordered today.  Return to clinic in 6 months for reevaluation.  Call sooner if concerns arise.        Relevant Medications   levothyroxine (SYNTHROID) 88 MCG tablet   Other Relevant Orders   TSH   T4, free     Other   Hypercholesteremia    Labs ordered at visit today.  Will make recommendations based on lab results.         Relevant Orders   Lipid panel   Other Visit Diagnoses     Annual physical exam    -  Primary   Health maintenance reviewed during visit today.  Labs ordered.  Vaccines up to date.  PAP and Mammogram up to date.   Relevant Orders   CBC with Differential/Platelet   Comprehensive metabolic panel   Lipid panel   TSH   Urinalysis, Routine w reflex microscopic   T4, free        Follow up plan: Return in about 1 year (around 09/07/2023) for Physical and Fasting labs.   LABORATORY TESTING:  - Pap smear: up to date  IMMUNIZATIONS:   - Tdap: Tetanus vaccination status reviewed: last tetanus booster within 10 years. - Influenza: Postponed to flu season - Pneumovax: Not applicable - Prevnar: Not applicable - COVID: Up to date - HPV: Not applicable - Shingrix vaccine: Not applicable  SCREENING: -Mammogram: Up to date  - Colonoscopy: Not applicable  - Bone Density: Not applicable  -Hearing Test: Not applicable  -Spirometry: Not applicable   PATIENT COUNSELING:   Advised to take 1 mg of  folate supplement per day if capable of pregnancy.   Sexuality: Discussed sexually transmitted diseases, partner selection,  use of condoms, avoidance of unintended pregnancy  and contraceptive alternatives.   Advised to avoid cigarette smoking.  I discussed with the patient that most people either abstain from alcohol or drink within safe limits (<=14/week and <=4 drinks/occasion for males, <=7/weeks and <= 3 drinks/occasion for females) and that the risk for alcohol disorders and other health effects rises proportionally with the number of drinks per week and how often a drinker exceeds daily limits.  Discussed cessation/primary prevention of drug use and availability of treatment for abuse.   Diet: Encouraged to adjust caloric intake to maintain  or achieve ideal body weight, to reduce intake of dietary saturated fat and total fat, to limit sodium intake by avoiding high sodium foods and not adding table salt, and to maintain adequate dietary potassium and calcium preferably from fresh fruits, vegetables, and low-fat dairy products.    stressed the importance of regular exercise  Injury prevention: Discussed safety belts, safety helmets, smoke detector, smoking near bedding or upholstery.   Dental health: Discussed importance of regular tooth brushing, flossing, and dental visits.    NEXT PREVENTATIVE PHYSICAL DUE IN 1 YEAR. Return in about 1 year (around 09/07/2023) for Physical and Fasting labs.

## 2022-09-08 LAB — CBC WITH DIFFERENTIAL/PLATELET
Basophils Absolute: 0 10*3/uL (ref 0.0–0.2)
Basos: 1 %
EOS (ABSOLUTE): 0.1 10*3/uL (ref 0.0–0.4)
Eos: 1 %
Hematocrit: 40.2 % (ref 34.0–46.6)
Hemoglobin: 13.7 g/dL (ref 11.1–15.9)
Immature Grans (Abs): 0 10*3/uL (ref 0.0–0.1)
Immature Granulocytes: 0 %
Lymphocytes Absolute: 2.2 10*3/uL (ref 0.7–3.1)
Lymphs: 35 %
MCH: 29.5 pg (ref 26.6–33.0)
MCHC: 34.1 g/dL (ref 31.5–35.7)
MCV: 87 fL (ref 79–97)
Monocytes Absolute: 0.3 10*3/uL (ref 0.1–0.9)
Monocytes: 5 %
Neutrophils Absolute: 3.7 10*3/uL (ref 1.4–7.0)
Neutrophils: 58 %
Platelets: 282 10*3/uL (ref 150–450)
RBC: 4.65 x10E6/uL (ref 3.77–5.28)
RDW: 12.9 % (ref 11.7–15.4)
WBC: 6.2 10*3/uL (ref 3.4–10.8)

## 2022-09-08 LAB — COMPREHENSIVE METABOLIC PANEL
ALT: 15 IU/L (ref 0–32)
AST: 22 IU/L (ref 0–40)
Albumin/Globulin Ratio: 1.9
Albumin: 5 g/dL — ABNORMAL HIGH (ref 3.9–4.9)
Alkaline Phosphatase: 99 IU/L (ref 44–121)
BUN/Creatinine Ratio: 19 (ref 9–23)
BUN: 13 mg/dL (ref 6–24)
Bilirubin Total: 0.4 mg/dL (ref 0.0–1.2)
CO2: 20 mmol/L (ref 20–29)
Calcium: 9.3 mg/dL (ref 8.7–10.2)
Chloride: 101 mmol/L (ref 96–106)
Creatinine, Ser: 0.69 mg/dL (ref 0.57–1.00)
Globulin, Total: 2.7 g/dL (ref 1.5–4.5)
Glucose: 77 mg/dL (ref 70–99)
Potassium: 3.9 mmol/L (ref 3.5–5.2)
Sodium: 142 mmol/L (ref 134–144)
Total Protein: 7.7 g/dL (ref 6.0–8.5)
eGFR: 110 mL/min/{1.73_m2} (ref >59)

## 2022-09-08 LAB — LIPID PANEL
Chol/HDL Ratio: 2.3 ratio (ref 0.0–4.4)
Cholesterol, Total: 249 mg/dL — ABNORMAL HIGH (ref 100–199)
HDL: 106 mg/dL (ref >39)
LDL Chol Calc (NIH): 130 mg/dL — ABNORMAL HIGH (ref 0–99)
Triglycerides: 78 mg/dL (ref 0–149)
VLDL Cholesterol Cal: 13 mg/dL (ref 5–40)

## 2022-09-08 LAB — TSH: TSH: 3.95 u[IU]/mL (ref 0.450–4.500)

## 2022-09-08 LAB — T4, FREE: Free T4: 1.43 ng/dL (ref 0.82–1.77)

## 2022-09-08 NOTE — Progress Notes (Signed)
Hi Tyne. It was nice to see you yesterday.  Your lab work looks good.  Your cholesterol remains elevated but relatively the same as last year.  Your cardiac risk score remains low.  No need for medication at this time.  I recommend a low fat diet.  Your thyroid labs are normal. Continue with your current dose of medication.  No concerns at this time. Continue with your current medication regimen.  Follow up as discussed.  Please let me know if you have any questions.

## 2022-09-18 ENCOUNTER — Other Ambulatory Visit: Payer: Self-pay

## 2022-10-14 ENCOUNTER — Other Ambulatory Visit: Payer: Self-pay

## 2023-01-27 DIAGNOSIS — N911 Secondary amenorrhea: Secondary | ICD-10-CM | POA: Diagnosis not present

## 2023-01-27 DIAGNOSIS — N898 Other specified noninflammatory disorders of vagina: Secondary | ICD-10-CM | POA: Diagnosis not present

## 2023-04-21 ENCOUNTER — Other Ambulatory Visit: Payer: Self-pay

## 2023-09-30 ENCOUNTER — Other Ambulatory Visit: Payer: Self-pay | Admitting: Nurse Practitioner

## 2023-09-30 NOTE — Telephone Encounter (Signed)
 Copied from CRM [id number]. Topic: Clinical - Medication Refill >> Sep 30, 2023 11:36 AM Elle L wrote: Medication: levothyroxine  (SYNTHROID ) 88 MCG tablet   Has the patient contacted their pharmacy? Yes  This is the patient's preferred pharmacy:  [facility] [address] [place] [address] Phone: [phone] Fax: [phone]  Is this the correct pharmacy for this prescription? Yes  Has the prescription been filled recently? Yes  Is the patient out of the medication? No, 2 weeks left.   Has the patient been seen for an appointment in the last year OR does the patient have an upcoming appointment? Yes  Can we respond through MyChart? Yes  Agent: Please be advised that Rx refills may take up to 3 business days. We ask that you follow-up with your pharmacy.

## 2023-10-04 NOTE — Telephone Encounter (Signed)
 Patient will be out before upcoming appointment.

## 2023-10-04 NOTE — Telephone Encounter (Signed)
 Requested medication (s) are due for refill today:   Yes  Requested medication (s) are on the active medication list:   Yes  Future visit scheduled:   Yes 7/22     LOV 09/07/2022 for physical   Last ordered: 09/07/2022 #90, 3 refills  Unable to refill because lab is due.  Provider to review for refill prior to upcoming appt.   Requested Prescriptions  Pending Prescriptions Disp Refills   levothyroxine  (SYNTHROID ) 88 MCG tablet 90 tablet 3    Sig: Take 1 tablet (88 mcg total) by mouth daily.     Endocrinology:  Hypothyroid Agents Failed - 10/04/2023  3:23 PM      Failed - TSH in normal range and within 360 days    TSH  Date Value Ref Range Status  09/07/2022 3.950 0.450 - 4.500 uIU/mL Final         Failed - Valid encounter within last 12 months    Recent Outpatient Visits   None

## 2023-10-05 ENCOUNTER — Other Ambulatory Visit: Payer: Self-pay

## 2023-10-05 MED ORDER — LEVOTHYROXINE SODIUM 88 MCG PO TABS
88.0000 ug | ORAL_TABLET | Freq: Every day | ORAL | 0 refills | Status: DC
  Filled 2023-10-05: qty 30, 30d supply, fill #0

## 2023-10-19 ENCOUNTER — Ambulatory Visit (INDEPENDENT_AMBULATORY_CARE_PROVIDER_SITE_OTHER): Admitting: Nurse Practitioner

## 2023-10-19 ENCOUNTER — Encounter: Payer: Self-pay | Admitting: Nurse Practitioner

## 2023-10-19 ENCOUNTER — Other Ambulatory Visit: Payer: Self-pay

## 2023-10-19 VITALS — BP 128/80 | HR 91 | Temp 97.9°F | Ht 64.2 in | Wt 145.8 lb

## 2023-10-19 DIAGNOSIS — Z0000 Encounter for general adult medical examination without abnormal findings: Secondary | ICD-10-CM | POA: Diagnosis not present

## 2023-10-19 DIAGNOSIS — E7800 Pure hypercholesterolemia, unspecified: Secondary | ICD-10-CM | POA: Diagnosis not present

## 2023-10-19 DIAGNOSIS — Z1231 Encounter for screening mammogram for malignant neoplasm of breast: Secondary | ICD-10-CM

## 2023-10-19 DIAGNOSIS — Z1211 Encounter for screening for malignant neoplasm of colon: Secondary | ICD-10-CM | POA: Diagnosis not present

## 2023-10-19 DIAGNOSIS — E038 Other specified hypothyroidism: Secondary | ICD-10-CM | POA: Diagnosis not present

## 2023-10-19 MED ORDER — LEVOTHYROXINE SODIUM 88 MCG PO TABS
88.0000 ug | ORAL_TABLET | Freq: Every day | ORAL | 3 refills | Status: AC
  Filled 2023-10-19 – 2023-10-29 (×2): qty 90, 90d supply, fill #0
  Filled 2024-02-16: qty 90, 90d supply, fill #1

## 2023-10-19 NOTE — Assessment & Plan Note (Signed)
 Labs ordered at visit today.  Will make recommendations based on lab results.

## 2023-10-19 NOTE — Patient Instructions (Signed)
 Please call to schedule your mammogram: [facility]  Address: [address], [place], [place] [address] Phone: [phone]  [facility] [address] [place],  [place]  [address] Phone: [phone]

## 2023-10-19 NOTE — Progress Notes (Signed)
 BP 128/80   Pulse 91   Temp 97.9 F (36.6 C) (Oral)   Ht 5' 4.2 (1.631 m)   Wt 145 lb 12.8 oz (66.1 kg)   LMP  (LMP Unknown)   SpO2 98%   BMI 24.87 kg/m    Subjective:    Patient ID: Debbie Rivera, female    DOB: Apr 07, 1980, 43 y.o.   MRN: [id number]  HPI: Debbie Rivera is a 43 y.o. female presenting on 10/19/2023 for comprehensive medical examination. Current medical complaints include:none  She currently lives with: Menopausal Symptoms: no  HYPOTHYROIDISM Thyroid  control status:controlled Satisfied with current treatment? yes Medication side effects: no Medication compliance: excellent compliance Etiology of hypothyroidism:  Recent dose adjustment:no Fatigue: no Cold intolerance: yes Heat intolerance: no Weight gain: no Weight loss: no Constipation: no Diarrhea/loose stools: no Palpitations: no Lower extremity edema: no Anxiety/depressed mood: no   Denies HA, CP, SOB, dizziness, palpitations, visual changes, and lower extremity swelling.   Depression Screen done today and results listed below:     10/19/2023    1:32 PM 09/07/2022    3:08 PM 08/18/2021    2:54 PM 02/26/2020    9:52 AM 02/08/2019   11:16 AM  Depression screen PHQ 2/9  Decreased Interest 0 0 0 0 0  Down, Depressed, Hopeless 0 0 0 0 0  PHQ - 2 Score 0 0 0 0 0  Altered sleeping 1 0 0  0  Tired, decreased energy 0 0 0  0  Change in appetite 0 0 0  0  Feeling bad or failure about yourself  0 0 1  0  Trouble concentrating 0 0 0  0  Moving slowly or fidgety/restless 0 0 0  0  Suicidal thoughts 0 0 0  0  PHQ-9 Score 1 0 1  0  Difficult doing work/chores Not difficult at all  Somewhat difficult      The patient does not have a history of falls. I did complete a risk assessment for falls. A plan of care for falls was documented.   Past Medical History:  Past Medical History:  Diagnosis Date   Gestational diabetes 2007   Hypothyroidism     Surgical History:  Past Surgical History:   Procedure Laterality Date   BREAST REDUCTION SURGERY Bilateral    BREAST SURGERY     CESAREAN SECTION     REDUCTION MAMMAPLASTY Bilateral 2009    Medications:  No current outpatient medications on file prior to visit.   No current facility-administered medications on file prior to visit.    Allergies:  No Known Allergies  Social History:  Social History   Socioeconomic History   Marital status: Married    Spouse name: Not on file   Number of children: Not on file   Years of education: Not on file   Highest education level: Not on file  Occupational History   Not on file  Tobacco Use   Smoking status: Former    Current packs/day: 0.00    Types: Cigarettes    Quit date: 10/13/2018    Years since quitting: 5.0   Smokeless tobacco: Never  Vaping Use   Vaping status: Never Used  Substance and Sexual Activity   Alcohol use: No   Drug use: No   Sexual activity: Yes    Birth control/protection: None  Other Topics Concern   Not on file  Social History Narrative   Not on file   Social Drivers of Health  Financial Resource Strain: Not on file  Food Insecurity: Not on file  Transportation Needs: Not on file  Physical Activity: Not on file  Stress: Not on file  Social Connections: Not on file  Intimate Partner Violence: Not on file   Social History   Tobacco Use  Smoking Status Former   Current packs/day: 0.00   Types: Cigarettes   Quit date: 10/13/2018   Years since quitting: 5.0  Smokeless Tobacco Never   Social History   Substance and Sexual Activity  Alcohol Use No    Family History:  Family History  Problem Relation Age of Onset   Diabetes Mother    Hypertension Maternal Grandfather    Breast cancer Neg Hx     Past medical history, surgical history, medications, allergies, family history and social history reviewed with patient today and changes made to appropriate areas of the chart.   Review of Systems  Constitutional:  Negative for  malaise/fatigue and weight loss.  Cardiovascular:  Negative for palpitations and leg swelling.  Gastrointestinal:  Negative for constipation and diarrhea.  Psychiatric/Behavioral:  Negative for depression. The patient is not nervous/anxious.    All other ROS negative except what is listed above and in the HPI.      Objective:    BP 128/80   Pulse 91   Temp 97.9 F (36.6 C) (Oral)   Ht 5' 4.2 (1.631 m)   Wt 145 lb 12.8 oz (66.1 kg)   LMP  (LMP Unknown)   SpO2 98%   BMI 24.87 kg/m   Wt Readings from Last 3 Encounters:  10/19/23 145 lb 12.8 oz (66.1 kg)  09/07/22 144 lb 6.4 oz (65.5 kg)  08/18/21 148 lb 6.4 oz (67.3 kg)    Physical Exam Vitals and nursing note reviewed.  Constitutional:      General: She is awake. She is not in acute distress.    Appearance: Normal appearance. She is well-developed and normal weight. She is not ill-appearing.  HENT:     Head: Normocephalic and atraumatic.     Right Ear: Hearing, tympanic membrane, ear canal and external ear normal. No drainage.     Left Ear: Hearing, tympanic membrane, ear canal and external ear normal. No drainage.     Nose: Nose normal.     Right Sinus: No maxillary sinus tenderness or frontal sinus tenderness.     Left Sinus: No maxillary sinus tenderness or frontal sinus tenderness.     Mouth/Throat:     Mouth: Mucous membranes are moist.     Pharynx: Oropharynx is clear. Uvula midline. No pharyngeal swelling, oropharyngeal exudate or posterior oropharyngeal erythema.  Eyes:     General: Lids are normal.        Right eye: No discharge.        Left eye: No discharge.     Extraocular Movements: Extraocular movements intact.     Conjunctiva/sclera: Conjunctivae normal.     Pupils: Pupils are equal, round, and reactive to light.     Visual Fields: Right eye visual fields normal and left eye visual fields normal.  Neck:     Thyroid : No thyromegaly.     Vascular: No carotid bruit.     Trachea: Trachea normal.   Cardiovascular:     Rate and Rhythm: Normal rate and regular rhythm.     Heart sounds: Normal heart sounds. No murmur heard.    No gallop.  Pulmonary:     Effort: Pulmonary effort is normal. No accessory muscle  usage or respiratory distress.     Breath sounds: Normal breath sounds.  Chest:  Breasts:    Right: Normal.     Left: Normal.  Abdominal:     General: Bowel sounds are normal.     Palpations: Abdomen is soft. There is no hepatomegaly or splenomegaly.     Tenderness: There is no abdominal tenderness.  Musculoskeletal:        General: Normal range of motion.     Cervical back: Normal range of motion and neck supple.     Right lower leg: No edema.     Left lower leg: No edema.  Lymphadenopathy:     Head:     Right side of head: No submental, submandibular, tonsillar, preauricular or posterior auricular adenopathy.     Left side of head: No submental, submandibular, tonsillar, preauricular or posterior auricular adenopathy.     Cervical: No cervical adenopathy.     Upper Body:     Right upper body: No supraclavicular, axillary or pectoral adenopathy.     Left upper body: No supraclavicular, axillary or pectoral adenopathy.  Skin:    General: Skin is warm and dry.     Capillary Refill: Capillary refill takes less than 2 seconds.     Findings: No rash.  Neurological:     Mental Status: She is alert and oriented to person, place, and time.     Gait: Gait is intact.     Deep Tendon Reflexes: Reflexes are normal and symmetric.     Reflex Scores:      Brachioradialis reflexes are 2+ on the right side and 2+ on the left side.      Patellar reflexes are 2+ on the right side and 2+ on the left side. Psychiatric:        Attention and Perception: Attention normal.        Mood and Affect: Mood normal.        Speech: Speech normal.        Behavior: Behavior normal. Behavior is cooperative.        Thought Content: Thought content normal.        Judgment: Judgment normal.      Results for orders placed or performed in visit on 09/07/22  HM PAP SMEAR   Collection Time: 04/02/22 12:00 AM  Result Value Ref Range   HM Pap smear Normal   Microscopic Examination   Collection Time: 09/07/22  3:26 PM   Urine  Result Value Ref Range   WBC, UA 0-5 0 - 5 /hpf   RBC, Urine 0-2 0 - 2 /hpf   Epithelial Cells (non renal) 0-10 0 - 10 /hpf   Bacteria, UA None seen None seen/Few  Urinalysis, Routine w reflex microscopic   Collection Time: 09/07/22  3:26 PM  Result Value Ref Range   Specific Gravity, UA 1.015 1.005 - 1.030   pH, UA 5.0 5.0 - 7.5   Color, UA Yellow Yellow   Appearance Ur Clear Clear   Leukocytes,UA Trace (A) Negative   Protein,UA Negative Negative/Trace   Glucose, UA Negative Negative   Ketones, UA 1+ (A) Negative   RBC, UA Negative Negative   Bilirubin, UA Negative Negative   Urobilinogen, Ur 0.2 0.2 - 1.0 mg/dL   Nitrite, UA Negative Negative   Microscopic Examination See below:   CBC with Differential/Platelet   Collection Time: 09/07/22  3:29 PM  Result Value Ref Range   WBC 6.2 3.4 - 10.8 x10E3/uL   RBC  4.65 3.77 - 5.28 x10E6/uL   Hemoglobin 13.7 11.1 - 15.9 g/dL   Hematocrit 59.7 65.9 - 46.6 %   MCV 87 79 - 97 fL   MCH 29.5 26.6 - 33.0 pg   MCHC 34.1 31.5 - 35.7 g/dL   RDW 87.0 88.2 - 84.5 %   Platelets 282 150 - 450 x10E3/uL   Neutrophils 58 Not Estab. %   Lymphs 35 Not Estab. %   Monocytes 5 Not Estab. %   Eos 1 Not Estab. %   Basos 1 Not Estab. %   Neutrophils Absolute 3.7 1.4 - 7.0 x10E3/uL   Lymphocytes Absolute 2.2 0.7 - 3.1 x10E3/uL   Monocytes Absolute 0.3 0.1 - 0.9 x10E3/uL   EOS (ABSOLUTE) 0.1 0.0 - 0.4 x10E3/uL   Basophils Absolute 0.0 0.0 - 0.2 x10E3/uL   Immature Granulocytes 0 Not Estab. %   Immature Grans (Abs) 0.0 0.0 - 0.1 x10E3/uL  Comprehensive metabolic panel   Collection Time: 09/07/22  3:29 PM  Result Value Ref Range   Glucose 77 70 - 99 mg/dL   BUN 13 6 - 24 mg/dL   Creatinine, Ser 9.30 0.57 - 1.00  mg/dL   eGFR 889 >40 fO/fpw/8.26   BUN/Creatinine Ratio 19 9 - 23   Sodium 142 134 - 144 mmol/L   Potassium 3.9 3.5 - 5.2 mmol/L   Chloride 101 96 - 106 mmol/L   CO2 20 20 - 29 mmol/L   Calcium 9.3 8.7 - 10.2 mg/dL   Total Protein 7.7 6.0 - 8.5 g/dL   Albumin 5.0 (H) 3.9 - 4.9 g/dL   Globulin, Total 2.7 1.5 - 4.5 g/dL   Albumin/Globulin Ratio 1.9    Bilirubin Total 0.4 0.0 - 1.2 mg/dL   Alkaline Phosphatase 99 44 - 121 IU/L   AST 22 0 - 40 IU/L   ALT 15 0 - 32 IU/L  Lipid panel   Collection Time: 09/07/22  3:29 PM  Result Value Ref Range   Cholesterol, Total 249 (H) 100 - 199 mg/dL   Triglycerides 78 0 - 149 mg/dL   HDL 893 >60 mg/dL   VLDL Cholesterol Cal 13 5 - 40 mg/dL   LDL Chol Calc (NIH) 869 (H) 0 - 99 mg/dL   LDL CALC COMMENT: CANCELED    Chol/HDL Ratio 2.3 0.0 - 4.4 ratio  TSH   Collection Time: 09/07/22  3:29 PM  Result Value Ref Range   TSH 3.950 0.450 - 4.500 uIU/mL  T4, free   Collection Time: 09/07/22  3:29 PM  Result Value Ref Range   Free T4 1.43 0.82 - 1.77 ng/dL      Assessment & Plan:   Problem List Items Addressed This Visit       Endocrine   Hypothyroidism   Chronic.  Controlled.  Continue with current medication regimen of Levothyroxine  88mcg daily.  Refills sent today.  Labs ordered today.  Return to clinic in 6 months for reevaluation.  Call sooner if concerns arise.       Relevant Medications   levothyroxine  (SYNTHROID ) 88 MCG tablet   Other Relevant Orders   TSH   T4     Other   Hypercholesteremia   Labs ordered at visit today.  Will make recommendations based on lab results.        Relevant Orders   Lipid panel   Other Visit Diagnoses       Annual physical exam    -  Primary   Health maintenance reviewed  during visit today.  Labs ordered.  Vaccines reviewed. Cologuard and Mammogram ordered.  PAP Up to date.   Relevant Orders   CBC with Differential/Platelet   Comprehensive metabolic panel with GFR   Lipid panel   TSH   T4      Encounter for screening mammogram for malignant neoplasm of breast       Relevant Orders   MM 3D SCREENING MAMMOGRAM BILATERAL BREAST     Screening for colon cancer       Relevant Orders   Cologuard        Follow up plan: Return in about 1 year (around 10/18/2024) for Physical and Fasting labs.   LABORATORY TESTING:  - Pap smear: up to date  IMMUNIZATIONS:   - Tdap: Tetanus vaccination status reviewed: last tetanus booster within 10 years. - Influenza: Postponed to flu season - Pneumovax: Not applicable - Prevnar: Not applicable - COVID: Up to date - HPV: Not applicable - Shingrix vaccine: Not applicable  SCREENING: -Mammogram: ordered today - Colonoscopy: ordered today - Bone Density: Not applicable  -Hearing Test: Not applicable  -Spirometry: Not applicable   PATIENT COUNSELING:   Advised to take 1 mg of folate supplement per day if capable of pregnancy.   Sexuality: Discussed sexually transmitted diseases, partner selection, use of condoms, avoidance of unintended pregnancy  and contraceptive alternatives.   Advised to avoid cigarette smoking.  I discussed with the patient that most people either abstain from alcohol or drink within safe limits (<=14/week and <=4 drinks/occasion for males, <=7/weeks and <= 3 drinks/occasion for females) and that the risk for alcohol disorders and other health effects rises proportionally with the number of drinks per week and how often a drinker exceeds daily limits.  Discussed cessation/primary prevention of drug use and availability of treatment for abuse.   Diet: Encouraged to adjust caloric intake to maintain  or achieve ideal body weight, to reduce intake of dietary saturated fat and total fat, to limit sodium intake by avoiding high sodium foods and not adding table salt, and to maintain adequate dietary potassium and calcium preferably from fresh fruits, vegetables, and low-fat dairy products.    stressed the importance of  regular exercise  Injury prevention: Discussed safety belts, safety helmets, smoke detector, smoking near bedding or upholstery.   Dental health: Discussed importance of regular tooth brushing, flossing, and dental visits.    NEXT PREVENTATIVE PHYSICAL DUE IN 1 YEAR. Return in about 1 year (around 10/18/2024) for Physical and Fasting labs.

## 2023-10-19 NOTE — Assessment & Plan Note (Signed)
Chronic.  Controlled.  Continue with current medication regimen of Levothyroxine daily.  Refills sent today.  Labs ordered today.  Return to clinic in 6 months for reevaluation.  Call sooner if concerns arise.

## 2023-10-20 ENCOUNTER — Ambulatory Visit: Payer: Self-pay | Admitting: Nurse Practitioner

## 2023-10-20 LAB — CBC WITH DIFFERENTIAL/PLATELET
Basophils Absolute: 0.1 x10E3/uL (ref 0.0–0.2)
Basos: 1 %
EOS (ABSOLUTE): 0.3 x10E3/uL (ref 0.0–0.4)
Eos: 6 %
Hematocrit: 42.6 % (ref 34.0–46.6)
Hemoglobin: 13.9 g/dL (ref 11.1–15.9)
Immature Grans (Abs): 0 x10E3/uL (ref 0.0–0.1)
Immature Granulocytes: 0 %
Lymphocytes Absolute: 2.2 x10E3/uL (ref 0.7–3.1)
Lymphs: 40 %
MCH: 29.1 pg (ref 26.6–33.0)
MCHC: 32.6 g/dL (ref 31.5–35.7)
MCV: 89 fL (ref 79–97)
Monocytes Absolute: 0.4 x10E3/uL (ref 0.1–0.9)
Monocytes: 7 %
Neutrophils Absolute: 2.5 x10E3/uL (ref 1.4–7.0)
Neutrophils: 46 %
Platelets: 276 x10E3/uL (ref 150–450)
RBC: 4.77 x10E6/uL (ref 3.77–5.28)
RDW: 13.2 % (ref 11.7–15.4)
WBC: 5.5 x10E3/uL (ref 3.4–10.8)

## 2023-10-20 LAB — COMPREHENSIVE METABOLIC PANEL WITH GFR
ALT: 14 IU/L (ref 0–32)
AST: 17 IU/L (ref 0–40)
Albumin: 4.6 g/dL (ref 3.9–4.9)
Alkaline Phosphatase: 82 IU/L (ref 44–121)
BUN/Creatinine Ratio: 19 (ref 9–23)
BUN: 14 mg/dL (ref 6–24)
Bilirubin Total: 0.4 mg/dL (ref 0.0–1.2)
CO2: 23 mmol/L (ref 20–29)
Calcium: 9.5 mg/dL (ref 8.7–10.2)
Chloride: 102 mmol/L (ref 96–106)
Creatinine, Ser: 0.72 mg/dL (ref 0.57–1.00)
Globulin, Total: 2.7 g/dL (ref 1.5–4.5)
Glucose: 89 mg/dL (ref 70–99)
Potassium: 4.3 mmol/L (ref 3.5–5.2)
Sodium: 141 mmol/L (ref 134–144)
Total Protein: 7.3 g/dL (ref 6.0–8.5)
eGFR: 105 mL/min/1.73 (ref >59)

## 2023-10-20 LAB — T4: T4, Total: 8.8 ug/dL (ref 4.5–12.0)

## 2023-10-20 LAB — LIPID PANEL
Chol/HDL Ratio: 2.6 ratio (ref 0.0–4.4)
Cholesterol, Total: 245 mg/dL — ABNORMAL HIGH (ref 100–199)
HDL: 96 mg/dL (ref >39)
LDL Chol Calc (NIH): 136 mg/dL — ABNORMAL HIGH (ref 0–99)
Triglycerides: 80 mg/dL (ref 0–149)
VLDL Cholesterol Cal: 13 mg/dL (ref 5–40)

## 2023-10-20 LAB — TSH: TSH: 0.269 u[IU]/mL — ABNORMAL LOW (ref 0.450–4.500)

## 2023-10-25 DIAGNOSIS — Z1211 Encounter for screening for malignant neoplasm of colon: Secondary | ICD-10-CM | POA: Diagnosis not present

## 2023-10-29 ENCOUNTER — Other Ambulatory Visit: Payer: Self-pay

## 2023-11-22 LAB — COLOGUARD: COLOGUARD: NEGATIVE
